# Patient Record
Sex: Female | Born: 1945 | Race: Black or African American | Hispanic: No | Marital: Married | Smoking: Never smoker
Health system: Southern US, Community
[De-identification: ages and names within clinical notes are randomized; demographics above are authoritative.]

## PROBLEM LIST (undated history)

## (undated) DIAGNOSIS — C189 Malignant neoplasm of colon, unspecified: Secondary | ICD-10-CM

## (undated) HISTORY — PX: LIVER BIOPSY: SHX301

---

## 1973-12-13 HISTORY — PX: TUBAL LIGATION: SHX77

## 1973-12-13 HISTORY — PX: CHOLECYSTECTOMY: SHX55

## 2001-05-30 ENCOUNTER — Other Ambulatory Visit: Admission: RE | Admit: 2001-05-30 | Discharge: 2001-05-30 | Payer: Self-pay | Admitting: Obstetrics and Gynecology

## 2006-12-13 HISTORY — PX: OVARY SURGERY: SHX727

## 2006-12-13 HISTORY — PX: COLECTOMY: SHX59

## 2006-12-20 ENCOUNTER — Encounter (HOSPITAL_COMMUNITY): Admission: RE | Admit: 2006-12-20 | Discharge: 2007-02-22 | Payer: Self-pay | Admitting: Family Medicine

## 2006-12-29 ENCOUNTER — Encounter: Admission: RE | Admit: 2006-12-29 | Discharge: 2006-12-29 | Payer: Self-pay | Admitting: Family Medicine

## 2006-12-30 ENCOUNTER — Ambulatory Visit: Payer: Self-pay | Admitting: Oncology

## 2007-01-02 LAB — COMPREHENSIVE METABOLIC PANEL
ALT: 10 U/L (ref 0–35)
BUN: 12 mg/dL (ref 6–23)
CO2: 21 mEq/L (ref 19–32)
Calcium: 9.4 mg/dL (ref 8.4–10.5)
Chloride: 109 mEq/L (ref 96–112)
Creatinine, Ser: 0.82 mg/dL (ref 0.40–1.20)
Glucose, Bld: 103 mg/dL — ABNORMAL HIGH (ref 70–99)
Total Bilirubin: 0.4 mg/dL (ref 0.3–1.2)

## 2007-01-02 LAB — CBC WITH DIFFERENTIAL/PLATELET
Basophils Absolute: 0 10*3/uL (ref 0.0–0.1)
Eosinophils Absolute: 0.1 10*3/uL (ref 0.0–0.5)
HGB: 10.1 g/dL — ABNORMAL LOW (ref 11.6–15.9)
LYMPH%: 17.1 % (ref 14.0–48.0)
MCV: 72.1 fL — ABNORMAL LOW (ref 81.0–101.0)
MONO#: 0.4 10*3/uL (ref 0.1–0.9)
MONO%: 5.4 % (ref 0.0–13.0)
NEUT#: 5.6 10*3/uL (ref 1.5–6.5)
Platelets: 467 10*3/uL — ABNORMAL HIGH (ref 145–400)
RDW: 24.4 % — ABNORMAL HIGH (ref 11.3–14.5)
WBC: 7.4 10*3/uL (ref 3.9–10.0)

## 2007-01-02 LAB — PROTIME-INR
INR: 1.1 — ABNORMAL LOW (ref 2.00–3.50)
Protime: 13.2 Seconds (ref 10.6–13.4)

## 2007-01-02 LAB — FERRITIN: Ferritin: 6 ng/mL — ABNORMAL LOW (ref 10–291)

## 2007-01-02 LAB — CA 125: CA 125: 7.4 U/mL (ref 0.0–30.2)

## 2007-01-02 LAB — CEA: CEA: 6.3 ng/mL — ABNORMAL HIGH (ref 0.0–5.0)

## 2007-01-05 ENCOUNTER — Ambulatory Visit (HOSPITAL_COMMUNITY): Admission: RE | Admit: 2007-01-05 | Discharge: 2007-01-05 | Payer: Self-pay | Admitting: Oncology

## 2007-01-06 ENCOUNTER — Ambulatory Visit (HOSPITAL_COMMUNITY): Admission: RE | Admit: 2007-01-06 | Discharge: 2007-01-06 | Payer: Self-pay | Admitting: Oncology

## 2007-01-09 LAB — FECAL OCCULT BLOOD, GUAIAC: Occult Blood: POSITIVE

## 2007-01-12 ENCOUNTER — Ambulatory Visit: Payer: Self-pay | Admitting: Internal Medicine

## 2007-01-13 ENCOUNTER — Ambulatory Visit (HOSPITAL_COMMUNITY): Admission: RE | Admit: 2007-01-13 | Discharge: 2007-01-13 | Payer: Self-pay | Admitting: Oncology

## 2007-01-16 ENCOUNTER — Encounter (INDEPENDENT_AMBULATORY_CARE_PROVIDER_SITE_OTHER): Payer: Self-pay | Admitting: Specialist

## 2007-01-16 ENCOUNTER — Ambulatory Visit: Payer: Self-pay | Admitting: Internal Medicine

## 2007-01-18 LAB — CBC WITH DIFFERENTIAL/PLATELET
BASO%: 0 % (ref 0.0–2.0)
EOS%: 0.9 % (ref 0.0–7.0)
HCT: 28 % — ABNORMAL LOW (ref 34.8–46.6)
MCH: 24 pg — ABNORMAL LOW (ref 26.0–34.0)
MCHC: 32.9 g/dL (ref 32.0–36.0)
NEUT%: 76.7 % (ref 39.6–76.8)
RBC: 3.83 10*6/uL (ref 3.70–5.32)
WBC: 7.9 10*3/uL (ref 3.9–10.0)
lymph#: 1.4 10*3/uL (ref 0.9–3.3)

## 2007-01-18 LAB — COMPREHENSIVE METABOLIC PANEL
Albumin: 3.7 g/dL (ref 3.5–5.2)
Alkaline Phosphatase: 77 U/L (ref 39–117)
Glucose, Bld: 89 mg/dL (ref 70–99)
Potassium: 3.4 mEq/L — ABNORMAL LOW (ref 3.5–5.3)
Sodium: 142 mEq/L (ref 135–145)
Total Protein: 7.8 g/dL (ref 6.0–8.3)

## 2007-01-18 LAB — MAGNESIUM: Magnesium: 1.9 mg/dL (ref 1.5–2.5)

## 2007-01-18 LAB — URINALYSIS, MICROSCOPIC - CHCC
Blood: NEGATIVE
Nitrite: NEGATIVE
Protein: <30 mg/dL
Specific Gravity, Urine: 1.03 (ref 1.003–1.035)
pH: 5 (ref 4.6–8.0)

## 2007-01-23 ENCOUNTER — Ambulatory Visit (HOSPITAL_COMMUNITY): Admission: RE | Admit: 2007-01-23 | Discharge: 2007-01-23 | Payer: Self-pay | Admitting: Oncology

## 2007-01-30 LAB — CBC WITH DIFFERENTIAL/PLATELET
Basophils Absolute: 0 10*3/uL (ref 0.0–0.1)
Eosinophils Absolute: 0.1 10*3/uL (ref 0.0–0.5)
HCT: 30.4 % — ABNORMAL LOW (ref 34.8–46.6)
HGB: 10 g/dL — ABNORMAL LOW (ref 11.6–15.9)
LYMPH%: 21.9 % (ref 14.0–48.0)
MCH: 25.2 pg — ABNORMAL LOW (ref 26.0–34.0)
MCV: 76.6 fL — ABNORMAL LOW (ref 81.0–101.0)
MONO%: 5.7 % (ref 0.0–13.0)
NEUT#: 4.6 10*3/uL (ref 1.5–6.5)
NEUT%: 70.5 % (ref 39.6–76.8)
Platelets: 404 10*3/uL — ABNORMAL HIGH (ref 145–400)

## 2007-01-30 LAB — COMPREHENSIVE METABOLIC PANEL
BUN: 11 mg/dL (ref 6–23)
CO2: 30 mEq/L (ref 19–32)
Creatinine, Ser: 0.95 mg/dL (ref 0.40–1.20)
Glucose, Bld: 102 mg/dL — ABNORMAL HIGH (ref 70–99)
Sodium: 141 mEq/L (ref 135–145)
Total Bilirubin: 0.6 mg/dL (ref 0.3–1.2)
Total Protein: 7.7 g/dL (ref 6.0–8.3)

## 2007-01-30 LAB — LACTATE DEHYDROGENASE: LDH: 122 U/L (ref 94–250)

## 2007-01-30 LAB — MAGNESIUM: Magnesium: 2.4 mg/dL (ref 1.5–2.5)

## 2007-02-06 LAB — COMPREHENSIVE METABOLIC PANEL
ALT: 13 U/L (ref 0–35)
AST: 21 U/L (ref 0–37)
Alkaline Phosphatase: 59 U/L (ref 39–117)
Creatinine, Ser: 0.78 mg/dL (ref 0.40–1.20)
Sodium: 140 mEq/L (ref 135–145)
Total Bilirubin: 0.5 mg/dL (ref 0.3–1.2)

## 2007-02-06 LAB — CBC WITH DIFFERENTIAL/PLATELET
BASO%: 0 % (ref 0.0–2.0)
Basophils Absolute: 0 10*3/uL (ref 0.0–0.1)
EOS%: 2.8 % (ref 0.0–7.0)
HGB: 10.5 g/dL — ABNORMAL LOW (ref 11.6–15.9)
MCH: 25.5 pg — ABNORMAL LOW (ref 26.0–34.0)
RDW: 30.4 % — ABNORMAL HIGH (ref 11.3–14.5)
WBC: 4.8 10*3/uL (ref 3.9–10.0)
lymph#: 1.4 10*3/uL (ref 0.9–3.3)

## 2007-02-09 ENCOUNTER — Ambulatory Visit: Payer: Self-pay | Admitting: Oncology

## 2007-02-13 LAB — COMPREHENSIVE METABOLIC PANEL
AST: 21 U/L (ref 0–37)
BUN: 13 mg/dL (ref 6–23)
Calcium: 9.4 mg/dL (ref 8.4–10.5)
Chloride: 105 mEq/L (ref 96–112)
Creatinine, Ser: 0.98 mg/dL (ref 0.40–1.20)
Total Bilirubin: 0.3 mg/dL (ref 0.3–1.2)

## 2007-02-13 LAB — CBC WITH DIFFERENTIAL/PLATELET
Basophils Absolute: 0 10*3/uL (ref 0.0–0.1)
EOS%: 2.4 % (ref 0.0–7.0)
HCT: 33.6 % — ABNORMAL LOW (ref 34.8–46.6)
HGB: 11 g/dL — ABNORMAL LOW (ref 11.6–15.9)
LYMPH%: 22.6 % (ref 14.0–48.0)
MCH: 25.9 pg — ABNORMAL LOW (ref 26.0–34.0)
MCV: 79.4 fL — ABNORMAL LOW (ref 81.0–101.0)
MONO%: 7.3 % (ref 0.0–13.0)
NEUT%: 67.6 % (ref 39.6–76.8)
Platelets: 476 10*3/uL — ABNORMAL HIGH (ref 145–400)
lymph#: 1.1 10*3/uL (ref 0.9–3.3)

## 2007-02-13 LAB — MAGNESIUM: Magnesium: 2.1 mg/dL (ref 1.5–2.5)

## 2007-02-21 LAB — COMPREHENSIVE METABOLIC PANEL
AST: 29 U/L (ref 0–37)
Albumin: 3.4 g/dL — ABNORMAL LOW (ref 3.5–5.2)
BUN: 10 mg/dL (ref 6–23)
Calcium: 9.2 mg/dL (ref 8.4–10.5)
Chloride: 106 mEq/L (ref 96–112)
Glucose, Bld: 130 mg/dL — ABNORMAL HIGH (ref 70–99)
Potassium: 3.3 mEq/L — ABNORMAL LOW (ref 3.5–5.3)
Sodium: 138 mEq/L (ref 135–145)
Total Protein: 7.5 g/dL (ref 6.0–8.3)

## 2007-02-21 LAB — CBC WITH DIFFERENTIAL/PLATELET
Basophils Absolute: 0 10*3/uL (ref 0.0–0.1)
Eosinophils Absolute: 0.1 10*3/uL (ref 0.0–0.5)
HGB: 11.5 g/dL — ABNORMAL LOW (ref 11.6–15.9)
NEUT#: 2.9 10*3/uL (ref 1.5–6.5)
RDW: 28.1 % — ABNORMAL HIGH (ref 11.3–14.5)
WBC: 4.4 10*3/uL (ref 3.9–10.0)
lymph#: 1.1 10*3/uL (ref 0.9–3.3)

## 2007-02-27 LAB — COMPREHENSIVE METABOLIC PANEL
ALT: 13 U/L (ref 0–35)
AST: 20 U/L (ref 0–37)
Albumin: 3.8 g/dL (ref 3.5–5.2)
Alkaline Phosphatase: 81 U/L (ref 39–117)
Glucose, Bld: 97 mg/dL (ref 70–99)
Potassium: 3.8 mEq/L (ref 3.5–5.3)
Sodium: 141 mEq/L (ref 135–145)
Total Bilirubin: 0.2 mg/dL — ABNORMAL LOW (ref 0.3–1.2)
Total Protein: 7.5 g/dL (ref 6.0–8.3)

## 2007-02-27 LAB — CBC WITH DIFFERENTIAL/PLATELET
BASO%: 0.2 % (ref 0.0–2.0)
Eosinophils Absolute: 0.1 10*3/uL (ref 0.0–0.5)
LYMPH%: 29 % (ref 14.0–48.0)
MCHC: 33 g/dL (ref 32.0–36.0)
MCV: 83.1 fL (ref 81.0–101.0)
MONO%: 10.2 % (ref 0.0–13.0)
NEUT#: 3.4 10*3/uL (ref 1.5–6.5)
RBC: 4.44 10*6/uL (ref 3.70–5.32)
RDW: 28 % — ABNORMAL HIGH (ref 11.3–14.5)
WBC: 5.7 10*3/uL (ref 3.9–10.0)

## 2007-02-27 LAB — MAGNESIUM: Magnesium: 1.8 mg/dL (ref 1.5–2.5)

## 2007-03-13 LAB — COMPREHENSIVE METABOLIC PANEL
ALT: 21 U/L (ref 0–35)
AST: 28 U/L (ref 0–37)
Albumin: 3.9 g/dL (ref 3.5–5.2)
Alkaline Phosphatase: 90 U/L (ref 39–117)
Potassium: 3.8 mEq/L (ref 3.5–5.3)
Sodium: 145 mEq/L (ref 135–145)
Total Protein: 7.5 g/dL (ref 6.0–8.3)

## 2007-03-13 LAB — CBC WITH DIFFERENTIAL/PLATELET
BASO%: 0.1 % (ref 0.0–2.0)
EOS%: 2.1 % (ref 0.0–7.0)
Eosinophils Absolute: 0.1 10*3/uL (ref 0.0–0.5)
MCH: 28.5 pg (ref 26.0–34.0)
MCHC: 34 g/dL (ref 32.0–36.0)
MCV: 83.9 fL (ref 81.0–101.0)
MONO%: 11.9 % (ref 0.0–13.0)
NEUT#: 1.9 10*3/uL (ref 1.5–6.5)
RBC: 4.64 10*6/uL (ref 3.70–5.32)
RDW: 20.7 % — ABNORMAL HIGH (ref 11.3–14.5)

## 2007-03-21 LAB — COMPREHENSIVE METABOLIC PANEL
ALT: 28 U/L (ref 0–35)
AST: 40 U/L — ABNORMAL HIGH (ref 0–37)
CO2: 22 mEq/L (ref 19–32)
Creatinine, Ser: 0.77 mg/dL (ref 0.40–1.20)
Total Bilirubin: 0.3 mg/dL (ref 0.3–1.2)

## 2007-03-21 LAB — CBC WITH DIFFERENTIAL/PLATELET
BASO%: 0.7 % (ref 0.0–2.0)
EOS%: 2.2 % (ref 0.0–7.0)
HCT: 40.5 % (ref 34.8–46.6)
LYMPH%: 44 % (ref 14.0–48.0)
MCH: 28.4 pg (ref 26.0–34.0)
MCHC: 33.7 g/dL (ref 32.0–36.0)
NEUT%: 43.8 % (ref 39.6–76.8)
Platelets: 221 10*3/uL (ref 145–400)

## 2007-03-21 LAB — LACTATE DEHYDROGENASE: LDH: 182 U/L (ref 94–250)

## 2007-03-22 ENCOUNTER — Ambulatory Visit: Payer: Self-pay | Admitting: Oncology

## 2007-03-23 ENCOUNTER — Ambulatory Visit (HOSPITAL_COMMUNITY): Admission: RE | Admit: 2007-03-23 | Discharge: 2007-03-23 | Payer: Self-pay | Admitting: Oncology

## 2007-03-27 LAB — CBC WITH DIFFERENTIAL/PLATELET
Basophils Absolute: 0 10*3/uL (ref 0.0–0.1)
Eosinophils Absolute: 0.1 10*3/uL (ref 0.0–0.5)
HGB: 13.4 g/dL (ref 11.6–15.9)
MCV: 84.9 fL (ref 81.0–101.0)
MONO#: 0.5 10*3/uL (ref 0.1–0.9)
MONO%: 13.5 % — ABNORMAL HIGH (ref 0.0–13.0)
NEUT#: 1.8 10*3/uL (ref 1.5–6.5)
RDW: 19.1 % — ABNORMAL HIGH (ref 11.3–14.5)
WBC: 3.8 10*3/uL — ABNORMAL LOW (ref 3.9–10.0)

## 2007-03-27 LAB — COMPREHENSIVE METABOLIC PANEL
ALT: 42 U/L — ABNORMAL HIGH (ref 0–35)
AST: 53 U/L — ABNORMAL HIGH (ref 0–37)
Creatinine, Ser: 0.86 mg/dL (ref 0.40–1.20)
Total Bilirubin: 0.3 mg/dL (ref 0.3–1.2)

## 2007-04-10 LAB — COMPREHENSIVE METABOLIC PANEL
Albumin: 3.9 g/dL (ref 3.5–5.2)
Alkaline Phosphatase: 106 U/L (ref 39–117)
BUN: 7 mg/dL (ref 6–23)
Glucose, Bld: 77 mg/dL (ref 70–99)
Potassium: 3.5 mEq/L (ref 3.5–5.3)
Total Bilirubin: 0.4 mg/dL (ref 0.3–1.2)

## 2007-04-10 LAB — CBC WITH DIFFERENTIAL/PLATELET
Basophils Absolute: 0 10*3/uL (ref 0.0–0.1)
Eosinophils Absolute: 0.1 10*3/uL (ref 0.0–0.5)
HCT: 42.3 % (ref 34.8–46.6)
HGB: 14.6 g/dL (ref 11.6–15.9)
LYMPH%: 32.9 % (ref 14.0–48.0)
MCV: 85.7 fL (ref 81.0–101.0)
MONO%: 12.7 % (ref 0.0–13.0)
NEUT#: 1.8 10*3/uL (ref 1.5–6.5)
NEUT%: 51.5 % (ref 39.6–76.8)
Platelets: 182 10*3/uL (ref 145–400)

## 2007-04-10 LAB — MAGNESIUM: Magnesium: 1.8 mg/dL (ref 1.5–2.5)

## 2007-04-10 LAB — LACTATE DEHYDROGENASE: LDH: 187 U/L (ref 94–250)

## 2007-04-24 LAB — COMPREHENSIVE METABOLIC PANEL
AST: 38 U/L — ABNORMAL HIGH (ref 0–37)
Albumin: 3.9 g/dL (ref 3.5–5.2)
BUN: 5 mg/dL — ABNORMAL LOW (ref 6–23)
CO2: 29 mEq/L (ref 19–32)
Calcium: 9.4 mg/dL (ref 8.4–10.5)
Chloride: 108 mEq/L (ref 96–112)
Creatinine, Ser: 0.9 mg/dL (ref 0.40–1.20)
Glucose, Bld: 87 mg/dL (ref 70–99)
Potassium: 3.5 mEq/L (ref 3.5–5.3)

## 2007-04-24 LAB — CBC WITH DIFFERENTIAL/PLATELET
Basophils Absolute: 0 10*3/uL (ref 0.0–0.1)
EOS%: 2.9 % (ref 0.0–7.0)
Eosinophils Absolute: 0.1 10*3/uL (ref 0.0–0.5)
HCT: 43.7 % (ref 34.8–46.6)
HGB: 14.9 g/dL (ref 11.6–15.9)
MCH: 29.5 pg (ref 26.0–34.0)
MONO#: 0.5 10*3/uL (ref 0.1–0.9)
NEUT#: 1.8 10*3/uL (ref 1.5–6.5)
NEUT%: 48.1 % (ref 39.6–76.8)
RDW: 19.5 % — ABNORMAL HIGH (ref 11.3–14.5)
lymph#: 1.3 10*3/uL (ref 0.9–3.3)

## 2007-04-24 LAB — LACTATE DEHYDROGENASE: LDH: 190 U/L (ref 94–250)

## 2007-04-24 LAB — MAGNESIUM: Magnesium: 1.8 mg/dL (ref 1.5–2.5)

## 2007-05-02 LAB — CBC WITH DIFFERENTIAL/PLATELET
Basophils Absolute: 0 10*3/uL (ref 0.0–0.1)
Eosinophils Absolute: 0 10*3/uL (ref 0.0–0.5)
HGB: 14 g/dL (ref 11.6–15.9)
MCV: 86.4 fL (ref 81.0–101.0)
MONO#: 0.2 10*3/uL (ref 0.1–0.9)
MONO%: 9.8 % (ref 0.0–13.0)
NEUT#: 1.4 10*3/uL — ABNORMAL LOW (ref 1.5–6.5)
Platelets: 125 10*3/uL — ABNORMAL LOW (ref 145–400)
RBC: 4.83 10*6/uL (ref 3.70–5.32)
RDW: 15.8 % — ABNORMAL HIGH (ref 11.3–14.5)
WBC: 2.5 10*3/uL — ABNORMAL LOW (ref 3.9–10.0)

## 2007-05-04 ENCOUNTER — Ambulatory Visit: Payer: Self-pay | Admitting: Oncology

## 2007-05-09 LAB — CBC WITH DIFFERENTIAL/PLATELET
Basophils Absolute: 0 10*3/uL (ref 0.0–0.1)
Eosinophils Absolute: 0 10*3/uL (ref 0.0–0.5)
HCT: 40.2 % (ref 34.8–46.6)
LYMPH%: 38.3 % (ref 14.0–48.0)
MCV: 86.4 fL (ref 81.0–101.0)
MONO#: 0.4 10*3/uL (ref 0.1–0.9)
MONO%: 12.8 % (ref 0.0–13.0)
NEUT#: 1.3 10*3/uL — ABNORMAL LOW (ref 1.5–6.5)
NEUT%: 46.7 % (ref 39.6–76.8)
Platelets: 124 10*3/uL — ABNORMAL LOW (ref 145–400)
RBC: 4.65 10*6/uL (ref 3.70–5.32)
WBC: 2.8 10*3/uL — ABNORMAL LOW (ref 3.9–10.0)

## 2007-05-09 LAB — COMPREHENSIVE METABOLIC PANEL
Alkaline Phosphatase: 99 U/L (ref 39–117)
BUN: 7 mg/dL (ref 6–23)
CO2: 27 mEq/L (ref 19–32)
Creatinine, Ser: 0.81 mg/dL (ref 0.40–1.20)
Glucose, Bld: 107 mg/dL — ABNORMAL HIGH (ref 70–99)
Total Bilirubin: 0.6 mg/dL (ref 0.3–1.2)
Total Protein: 7.4 g/dL (ref 6.0–8.3)

## 2007-05-09 LAB — LACTATE DEHYDROGENASE: LDH: 182 U/L (ref 94–250)

## 2007-05-09 LAB — MAGNESIUM: Magnesium: 1.8 mg/dL (ref 1.5–2.5)

## 2007-05-18 ENCOUNTER — Ambulatory Visit (HOSPITAL_COMMUNITY): Admission: RE | Admit: 2007-05-18 | Discharge: 2007-05-18 | Payer: Self-pay | Admitting: Oncology

## 2007-05-22 LAB — COMPREHENSIVE METABOLIC PANEL
ALT: 29 U/L (ref 0–35)
AST: 44 U/L — ABNORMAL HIGH (ref 0–37)
Albumin: 3.8 g/dL (ref 3.5–5.2)
CO2: 24 mEq/L (ref 19–32)
Calcium: 9 mg/dL (ref 8.4–10.5)
Chloride: 109 mEq/L (ref 96–112)
Creatinine, Ser: 0.82 mg/dL (ref 0.40–1.20)
Potassium: 3.9 mEq/L (ref 3.5–5.3)
Sodium: 143 mEq/L (ref 135–145)
Total Protein: 7.4 g/dL (ref 6.0–8.3)

## 2007-05-22 LAB — CBC WITH DIFFERENTIAL/PLATELET
BASO%: 1.3 % (ref 0.0–2.0)
EOS%: 1.4 % (ref 0.0–7.0)
HCT: 44 % (ref 34.8–46.6)
MCH: 29.9 pg (ref 26.0–34.0)
MCHC: 33.6 g/dL (ref 32.0–36.0)
MONO#: 0.4 10*3/uL (ref 0.1–0.9)
RBC: 4.95 10*6/uL (ref 3.70–5.32)
RDW: 16.8 % — ABNORMAL HIGH (ref 11.3–14.5)
WBC: 3.4 10*3/uL — ABNORMAL LOW (ref 3.9–10.0)
lymph#: 1.1 10*3/uL (ref 0.9–3.3)

## 2007-05-22 LAB — LACTATE DEHYDROGENASE: LDH: 183 U/L (ref 94–250)

## 2007-05-30 LAB — CBC WITH DIFFERENTIAL/PLATELET
EOS%: 1.1 % (ref 0.0–7.0)
Eosinophils Absolute: 0 10*3/uL (ref 0.0–0.5)
LYMPH%: 28.1 % (ref 14.0–48.0)
MCH: 30.8 pg (ref 26.0–34.0)
MCV: 88.2 fL (ref 81.0–101.0)
MONO%: 8.9 % (ref 0.0–13.0)
NEUT#: 2.5 10*3/uL (ref 1.5–6.5)
Platelets: 146 10*3/uL (ref 145–400)
RBC: 4.59 10*6/uL (ref 3.70–5.32)

## 2007-06-05 LAB — COMPREHENSIVE METABOLIC PANEL
ALT: 27 U/L (ref 0–35)
AST: 44 U/L — ABNORMAL HIGH (ref 0–37)
Albumin: 3.7 g/dL (ref 3.5–5.2)
Alkaline Phosphatase: 143 U/L — ABNORMAL HIGH (ref 39–117)
Potassium: 3.9 mEq/L (ref 3.5–5.3)
Sodium: 141 mEq/L (ref 135–145)
Total Protein: 7.6 g/dL (ref 6.0–8.3)

## 2007-06-05 LAB — CBC WITH DIFFERENTIAL/PLATELET
BASO%: 0.5 % (ref 0.0–2.0)
LYMPH%: 33.5 % (ref 14.0–48.0)
MCHC: 35 g/dL (ref 32.0–36.0)
MCV: 90.7 fL (ref 81.0–101.0)
MONO#: 0.5 10*3/uL (ref 0.1–0.9)
MONO%: 15.1 % — ABNORMAL HIGH (ref 0.0–13.0)
Platelets: 185 10*3/uL (ref 145–400)
RBC: 4.57 10*6/uL (ref 3.70–5.32)
RDW: 18.5 % — ABNORMAL HIGH (ref 11.3–14.5)
WBC: 3.6 10*3/uL — ABNORMAL LOW (ref 3.9–10.0)

## 2007-06-14 ENCOUNTER — Ambulatory Visit: Payer: Self-pay | Admitting: Oncology

## 2007-06-19 LAB — COMPREHENSIVE METABOLIC PANEL
ALT: 24 U/L (ref 0–35)
AST: 40 U/L — ABNORMAL HIGH (ref 0–37)
BUN: 8 mg/dL (ref 6–23)
CO2: 24 mEq/L (ref 19–32)
Creatinine, Ser: 0.85 mg/dL (ref 0.40–1.20)
Total Bilirubin: 0.6 mg/dL (ref 0.3–1.2)

## 2007-06-19 LAB — CBC WITH DIFFERENTIAL/PLATELET
BASO%: 0.5 % (ref 0.0–2.0)
Basophils Absolute: 0 10*3/uL (ref 0.0–0.1)
EOS%: 2.1 % (ref 0.0–7.0)
HCT: 40.5 % (ref 34.8–46.6)
LYMPH%: 32.3 % (ref 14.0–48.0)
MCH: 32.5 pg (ref 26.0–34.0)
MCHC: 35.4 g/dL (ref 32.0–36.0)
NEUT%: 46.1 % (ref 39.6–76.8)
Platelets: 168 10*3/uL (ref 145–400)

## 2007-06-19 LAB — LACTATE DEHYDROGENASE: LDH: 180 U/L (ref 94–250)

## 2007-07-03 LAB — COMPREHENSIVE METABOLIC PANEL
Alkaline Phosphatase: 127 U/L — ABNORMAL HIGH (ref 39–117)
Glucose, Bld: 65 mg/dL — ABNORMAL LOW (ref 70–99)
Sodium: 143 mEq/L (ref 135–145)
Total Bilirubin: 0.7 mg/dL (ref 0.3–1.2)
Total Protein: 7.3 g/dL (ref 6.0–8.3)

## 2007-07-03 LAB — CBC WITH DIFFERENTIAL/PLATELET
Eosinophils Absolute: 0.1 10*3/uL (ref 0.0–0.5)
LYMPH%: 18.6 % (ref 14.0–48.0)
MCHC: 34.3 g/dL (ref 32.0–36.0)
MCV: 95.1 fL (ref 81.0–101.0)
MONO%: 12.7 % (ref 0.0–13.0)
Platelets: 216 10*3/uL (ref 145–400)
RBC: 4.36 10*6/uL (ref 3.70–5.32)

## 2007-07-03 LAB — MAGNESIUM: Magnesium: 1.7 mg/dL (ref 1.5–2.5)

## 2007-07-13 ENCOUNTER — Ambulatory Visit (HOSPITAL_COMMUNITY): Admission: RE | Admit: 2007-07-13 | Discharge: 2007-07-13 | Payer: Self-pay | Admitting: Oncology

## 2007-07-17 LAB — COMPREHENSIVE METABOLIC PANEL
AST: 31 U/L (ref 0–37)
Albumin: 3.7 g/dL (ref 3.5–5.2)
BUN: 13 mg/dL (ref 6–23)
Calcium: 9.4 mg/dL (ref 8.4–10.5)
Chloride: 106 mEq/L (ref 96–112)
Glucose, Bld: 91 mg/dL (ref 70–99)
Potassium: 4 mEq/L (ref 3.5–5.3)
Total Protein: 7.6 g/dL (ref 6.0–8.3)

## 2007-07-17 LAB — CBC WITH DIFFERENTIAL/PLATELET
Basophils Absolute: 0 10*3/uL (ref 0.0–0.1)
EOS%: 1.4 % (ref 0.0–7.0)
Eosinophils Absolute: 0.1 10*3/uL (ref 0.0–0.5)
HGB: 14 g/dL (ref 11.6–15.9)
NEUT#: 4.3 10*3/uL (ref 1.5–6.5)
RDW: 15.6 % — ABNORMAL HIGH (ref 11.3–14.5)
lymph#: 1 10*3/uL (ref 0.9–3.3)

## 2007-07-26 ENCOUNTER — Ambulatory Visit (HOSPITAL_COMMUNITY): Admission: RE | Admit: 2007-07-26 | Discharge: 2007-07-26 | Payer: Self-pay | Admitting: Oncology

## 2007-08-06 ENCOUNTER — Ambulatory Visit: Payer: Self-pay | Admitting: Oncology

## 2007-08-07 LAB — CBC WITH DIFFERENTIAL/PLATELET
Basophils Absolute: 0 10*3/uL (ref 0.0–0.1)
EOS%: 0.5 % (ref 0.0–7.0)
Eosinophils Absolute: 0 10*3/uL (ref 0.0–0.5)
HGB: 14.6 g/dL (ref 11.6–15.9)
NEUT#: 6.6 10*3/uL — ABNORMAL HIGH (ref 1.5–6.5)
RDW: 14 % (ref 11.3–14.5)
WBC: 9 10*3/uL (ref 3.9–10.0)
lymph#: 1.8 10*3/uL (ref 0.9–3.3)

## 2007-08-07 LAB — COMPREHENSIVE METABOLIC PANEL
AST: 23 U/L (ref 0–37)
Albumin: 4.1 g/dL (ref 3.5–5.2)
BUN: 16 mg/dL (ref 6–23)
Calcium: 9.8 mg/dL (ref 8.4–10.5)
Chloride: 102 mEq/L (ref 96–112)
Glucose, Bld: 116 mg/dL — ABNORMAL HIGH (ref 70–99)
Potassium: 3.7 mEq/L (ref 3.5–5.3)
Total Protein: 8.4 g/dL — ABNORMAL HIGH (ref 6.0–8.3)

## 2007-08-21 LAB — COMPREHENSIVE METABOLIC PANEL
AST: 22 U/L (ref 0–37)
Albumin: 3.8 g/dL (ref 3.5–5.2)
Alkaline Phosphatase: 113 U/L (ref 39–117)
BUN: 16 mg/dL (ref 6–23)
Calcium: 9.6 mg/dL (ref 8.4–10.5)
Chloride: 104 mEq/L (ref 96–112)
Glucose, Bld: 91 mg/dL (ref 70–99)
Potassium: 3.8 mEq/L (ref 3.5–5.3)
Sodium: 140 mEq/L (ref 135–145)
Total Protein: 7.8 g/dL (ref 6.0–8.3)

## 2007-08-21 LAB — CBC WITH DIFFERENTIAL/PLATELET
BASO%: 0.4 % (ref 0.0–2.0)
EOS%: 0.6 % (ref 0.0–7.0)
LYMPH%: 18.5 % (ref 14.0–48.0)
MCH: 32.7 pg (ref 26.0–34.0)
MCHC: 34.8 g/dL (ref 32.0–36.0)
MONO#: 0.4 10*3/uL (ref 0.1–0.9)
MONO%: 5.8 % (ref 0.0–13.0)
Platelets: 269 10*3/uL (ref 145–400)
RBC: 4.23 10*6/uL (ref 3.70–5.32)
WBC: 6.5 10*3/uL (ref 3.9–10.0)

## 2007-08-21 LAB — RESEARCH LABS

## 2007-09-12 LAB — CBC WITH DIFFERENTIAL/PLATELET
BASO%: 0.5 % (ref 0.0–2.0)
EOS%: 0.5 % (ref 0.0–7.0)
HCT: 37.5 % (ref 34.8–46.6)
LYMPH%: 17.5 % (ref 14.0–48.0)
MCH: 32.2 pg (ref 26.0–34.0)
MCHC: 35.3 g/dL (ref 32.0–36.0)
MONO%: 10 % (ref 0.0–13.0)
NEUT%: 71.5 % (ref 39.6–76.8)
Platelets: 256 10*3/uL (ref 145–400)
RBC: 4.11 10*6/uL (ref 3.70–5.32)

## 2007-09-12 LAB — COMPREHENSIVE METABOLIC PANEL
ALT: 33 U/L (ref 0–35)
AST: 35 U/L (ref 0–37)
Alkaline Phosphatase: 131 U/L — ABNORMAL HIGH (ref 39–117)
Creatinine, Ser: 0.67 mg/dL (ref 0.40–1.20)
Sodium: 138 mEq/L (ref 135–145)
Total Bilirubin: 0.9 mg/dL (ref 0.3–1.2)

## 2007-09-12 LAB — CEA: CEA: 12.7 ng/mL — ABNORMAL HIGH (ref 0.0–5.0)

## 2007-09-13 ENCOUNTER — Ambulatory Visit (HOSPITAL_COMMUNITY): Admission: RE | Admit: 2007-09-13 | Discharge: 2007-09-13 | Payer: Self-pay | Admitting: Oncology

## 2007-09-19 ENCOUNTER — Ambulatory Visit: Payer: Self-pay | Admitting: Oncology

## 2007-10-05 ENCOUNTER — Ambulatory Visit: Payer: Self-pay | Admitting: Cardiology

## 2007-10-05 LAB — CBC WITH DIFFERENTIAL/PLATELET
Basophils Absolute: 0 10*3/uL (ref 0.0–0.1)
Eosinophils Absolute: 0 10*3/uL (ref 0.0–0.5)
HGB: 13.8 g/dL (ref 11.6–15.9)
LYMPH%: 16.3 % (ref 14.0–48.0)
MCV: 91.7 fL (ref 81.0–101.0)
MONO#: 0.4 10*3/uL (ref 0.1–0.9)
MONO%: 6.2 % (ref 0.0–13.0)
NEUT#: 4.9 10*3/uL (ref 1.5–6.5)
Platelets: 390 10*3/uL (ref 145–400)
RDW: 13.6 % (ref 11.3–14.5)
WBC: 6.3 10*3/uL (ref 3.9–10.0)

## 2007-10-05 LAB — BASIC METABOLIC PANEL
BUN: 9 mg/dL (ref 6–23)
CO2: 27 mEq/L (ref 19–32)
Glucose, Bld: 103 mg/dL — ABNORMAL HIGH (ref 70–99)
Potassium: 3.8 mEq/L (ref 3.5–5.3)
Sodium: 136 mEq/L (ref 135–145)

## 2007-11-07 ENCOUNTER — Ambulatory Visit: Payer: Self-pay | Admitting: Oncology

## 2007-12-05 LAB — COMPREHENSIVE METABOLIC PANEL
Alkaline Phosphatase: 162 U/L — ABNORMAL HIGH (ref 39–117)
BUN: 9 mg/dL (ref 6–23)
CO2: 21 mEq/L (ref 19–32)
Creatinine, Ser: 0.65 mg/dL (ref 0.40–1.20)
Glucose, Bld: 85 mg/dL (ref 70–99)
Sodium: 143 mEq/L (ref 135–145)
Total Bilirubin: 0.5 mg/dL (ref 0.3–1.2)
Total Protein: 7.6 g/dL (ref 6.0–8.3)

## 2007-12-05 LAB — MAGNESIUM: Magnesium: 1.9 mg/dL (ref 1.5–2.5)

## 2007-12-21 ENCOUNTER — Ambulatory Visit: Payer: Self-pay | Admitting: Oncology

## 2008-02-06 ENCOUNTER — Ambulatory Visit: Payer: Self-pay | Admitting: Oncology

## 2008-02-08 LAB — CBC WITH DIFFERENTIAL/PLATELET
Basophils Absolute: 0 10*3/uL (ref 0.0–0.1)
Eosinophils Absolute: 0.1 10*3/uL (ref 0.0–0.5)
HCT: 34 % — ABNORMAL LOW (ref 34.8–46.6)
HGB: 11.9 g/dL (ref 11.6–15.9)
LYMPH%: 22.2 % (ref 14.0–48.0)
MCV: 88.3 fL (ref 81.0–101.0)
MONO#: 0.3 10*3/uL (ref 0.1–0.9)
MONO%: 6 % (ref 0.0–13.0)
NEUT#: 3.7 10*3/uL (ref 1.5–6.5)
NEUT%: 70.3 % (ref 39.6–76.8)
Platelets: 249 10*3/uL (ref 145–400)

## 2008-02-08 LAB — COMPREHENSIVE METABOLIC PANEL
Alkaline Phosphatase: 119 U/L — ABNORMAL HIGH (ref 39–117)
BUN: 12 mg/dL (ref 6–23)
CO2: 25 mEq/L (ref 19–32)
Glucose, Bld: 110 mg/dL — ABNORMAL HIGH (ref 70–99)
Total Bilirubin: 0.5 mg/dL (ref 0.3–1.2)

## 2008-03-19 ENCOUNTER — Ambulatory Visit: Payer: Self-pay | Admitting: Oncology

## 2008-03-19 ENCOUNTER — Encounter: Payer: Self-pay | Admitting: Internal Medicine

## 2008-03-19 LAB — CBC WITH DIFFERENTIAL/PLATELET
Basophils Absolute: 0 10*3/uL (ref 0.0–0.1)
Eosinophils Absolute: 0 10*3/uL (ref 0.0–0.5)
HGB: 12.8 g/dL (ref 11.6–15.9)
NEUT#: 1.7 10*3/uL (ref 1.5–6.5)
RBC: 4.15 10*6/uL (ref 3.70–5.32)
RDW: 15 % — ABNORMAL HIGH (ref 11.3–14.5)
WBC: 3.3 10*3/uL — ABNORMAL LOW (ref 3.9–10.0)
lymph#: 1.2 10*3/uL (ref 0.9–3.3)

## 2008-03-19 LAB — COMPREHENSIVE METABOLIC PANEL
ALT: 20 U/L (ref 0–35)
Albumin: 3.3 g/dL — ABNORMAL LOW (ref 3.5–5.2)
CO2: 26 mEq/L (ref 19–32)
Chloride: 108 mEq/L (ref 96–112)
Glucose, Bld: 114 mg/dL — ABNORMAL HIGH (ref 70–99)
Potassium: 3.5 mEq/L (ref 3.5–5.3)
Sodium: 137 mEq/L (ref 135–145)
Total Protein: 7.3 g/dL (ref 6.0–8.3)

## 2008-03-19 LAB — UA PROTEIN, DIPSTICK - CHCC: Protein, Urine: NEGATIVE mg/dL

## 2008-04-02 LAB — COMPREHENSIVE METABOLIC PANEL
ALT: 21 U/L (ref 0–35)
AST: 28 U/L (ref 0–37)
Alkaline Phosphatase: 108 U/L (ref 39–117)
BUN: 9 mg/dL (ref 6–23)
Calcium: 9.2 mg/dL (ref 8.4–10.5)
Chloride: 108 mEq/L (ref 96–112)
Creatinine, Ser: 0.81 mg/dL (ref 0.40–1.20)
Potassium: 3.5 mEq/L (ref 3.5–5.3)

## 2008-04-02 LAB — CBC WITH DIFFERENTIAL/PLATELET
BASO%: 0.8 % (ref 0.0–2.0)
Basophils Absolute: 0 10*3/uL (ref 0.0–0.1)
EOS%: 1.1 % (ref 0.0–7.0)
MCH: 31.1 pg (ref 26.0–34.0)
MCHC: 33.8 g/dL (ref 32.0–36.0)
MCV: 92.1 fL (ref 81.0–101.0)
MONO%: 9.3 % (ref 0.0–13.0)
NEUT%: 55 % (ref 39.6–76.8)
RDW: 15.4 % — ABNORMAL HIGH (ref 11.3–14.5)
lymph#: 1.4 10*3/uL (ref 0.9–3.3)

## 2008-04-02 LAB — UA PROTEIN, DIPSTICK - CHCC: Protein, Urine: 30 mg/dL

## 2008-04-12 IMAGING — US IR US GUIDE VASC ACCESS RIGHT
1 series · 1 of 1 positions shown · non-contrast
Comparison: none

CLINICAL DATA: Metastatic colon carcinoma.  Patient requires portacath to begin chemotherapy.
 SINGLE-LUMEN IMPLANTED PORTACATH WITH ULTRASOUND AND FLUOROSCOPIC GUIDANCE ? 01/23/07: 
 Prior to the procedure informed consent was obtained. The patient received 1 gm IV Ancef.  
 Sedation:  4 mg IV Versed, 100 mcg IV fentanyl.
 Total Moderate Sedation Time:   45 minutes.
 Fluoro Time:   0.2 minutes.
 Procedure:  The right neck and chest were sterilely prepped and draped.  Local anesthesia was provided with 1% lidocaine as well as lidocaine with epinephrine.  A small venotomy incision was created at the base of the neck.  Under direct ultrasound guidance a 21 gauge needle was advanced into the right internal jugular vein.  After securing guidewire access an 8 French dilator was placed.  A wire was kinked to measure an appropriate length of catheter.
 A subcutaneous port pocket was formed along the right upper chest wall utilizing sharp and blunt dissection.  Portable cautery was utilized.  The pocket was flushed with sterile saline.  Maykelis Ben Basat Power Port with attached 8 French catheter was placed.  The catheter was tunneled from the port pocket site to the venotomy incision.  The port was secured in the pocket with two separate Ethilon tacking sutures.  The external catheter was cut to appropriate length. This catheter was then placed through a Peel-Away sheath.  Final catheter position was confirmed with a fluoroscopic spot image. The catheter was accessed, aspirated, and flushed with saline and injected with a heparinized saline solution. The access needle was then removed.  Both the port pocket incision and the venotomy incision were then closed with subcutaneous 3-0 Monocril, subcuticular 4-0 Vicryl, and Dermabond.  
 Complications:  None.   No pneumothorax

[Series 1: sp us guide vasc access*right* · 1 of 1 slices shown]
[im 1/1]
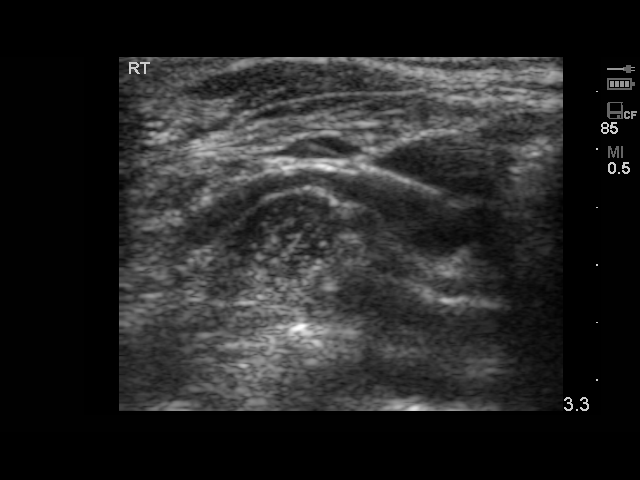

[1 of 1 positions shown; findings below may reference images not displayed]

FINDINGS: After placement of the catheter, the catheter tip lies at the cavoatrial junction.  The catheter is ready for immediate use.
IMPRESSION: Placement of a single-lumen implanted portacath via right internal jugular vein with the catheter tip at the cavoatrial junction.  A Power Port was placed which does have indications for power injector use.

## 2008-04-30 ENCOUNTER — Ambulatory Visit: Payer: Self-pay | Admitting: Oncology

## 2008-05-14 LAB — COMPREHENSIVE METABOLIC PANEL
Alkaline Phosphatase: 97 U/L (ref 39–117)
BUN: 17 mg/dL (ref 6–23)
CO2: 24 mEq/L (ref 19–32)
Glucose, Bld: 94 mg/dL (ref 70–99)
Total Bilirubin: 0.5 mg/dL (ref 0.3–1.2)
Total Protein: 7.2 g/dL (ref 6.0–8.3)

## 2008-05-14 LAB — CBC WITH DIFFERENTIAL/PLATELET
BASO%: 0.4 % (ref 0.0–2.0)
HCT: 36.5 % (ref 34.8–46.6)
HGB: 12.7 g/dL (ref 11.6–15.9)
MCHC: 34.8 g/dL (ref 32.0–36.0)
MONO#: 0.3 10*3/uL (ref 0.1–0.9)
NEUT%: 51 % (ref 39.6–76.8)
WBC: 3.1 10*3/uL — ABNORMAL LOW (ref 3.9–10.0)
lymph#: 1.1 10*3/uL (ref 0.9–3.3)

## 2008-05-14 LAB — UA PROTEIN, DIPSTICK - CHCC: Protein, Urine: <30 mg/dL

## 2008-05-28 ENCOUNTER — Ambulatory Visit (HOSPITAL_COMMUNITY): Admission: RE | Admit: 2008-05-28 | Discharge: 2008-05-28 | Payer: Self-pay | Admitting: Oncology

## 2008-06-10 ENCOUNTER — Ambulatory Visit: Payer: Self-pay | Admitting: Oncology

## 2008-06-10 LAB — CBC WITH DIFFERENTIAL/PLATELET
Basophils Absolute: 0 10*3/uL (ref 0.0–0.1)
EOS%: 1.4 % (ref 0.0–7.0)
Eosinophils Absolute: 0.1 10*3/uL (ref 0.0–0.5)
HCT: 39.6 % (ref 34.8–46.6)
HGB: 13.7 g/dL (ref 11.6–15.9)
MCH: 32.4 pg (ref 26.0–34.0)
MONO#: 0.4 10*3/uL (ref 0.1–0.9)
NEUT%: 67.8 % (ref 39.6–76.8)
lymph#: 1.1 10*3/uL (ref 0.9–3.3)

## 2008-06-10 LAB — COMPREHENSIVE METABOLIC PANEL
Albumin: 3.7 g/dL (ref 3.5–5.2)
BUN: 11 mg/dL (ref 6–23)
CO2: 31 mEq/L (ref 19–32)
Calcium: 9.6 mg/dL (ref 8.4–10.5)
Chloride: 106 mEq/L (ref 96–112)
Glucose, Bld: 107 mg/dL — ABNORMAL HIGH (ref 70–99)
Potassium: 3.9 mEq/L (ref 3.5–5.3)

## 2008-06-24 LAB — COMPREHENSIVE METABOLIC PANEL
ALT: 18 U/L (ref 0–35)
Albumin: 4 g/dL (ref 3.5–5.2)
Alkaline Phosphatase: 88 U/L (ref 39–117)
CO2: 23 mEq/L (ref 19–32)
Glucose, Bld: 108 mg/dL — ABNORMAL HIGH (ref 70–99)
Potassium: 4.2 mEq/L (ref 3.5–5.3)
Sodium: 142 mEq/L (ref 135–145)
Total Protein: 7 g/dL (ref 6.0–8.3)

## 2008-06-24 LAB — CBC WITH DIFFERENTIAL/PLATELET
Basophils Absolute: 0 10*3/uL (ref 0.0–0.1)
EOS%: 1.1 % (ref 0.0–7.0)
HCT: 39.2 % (ref 34.8–46.6)
HGB: 13.3 g/dL (ref 11.6–15.9)
MCH: 32.1 pg (ref 26.0–34.0)
MCV: 94.3 fL (ref 81.0–101.0)
MONO%: 8 % (ref 0.0–13.0)
NEUT%: 67.2 % (ref 39.6–76.8)
Platelets: 158 10*3/uL (ref 145–400)

## 2008-07-08 LAB — CBC WITH DIFFERENTIAL/PLATELET
BASO%: 0.3 % (ref 0.0–2.0)
EOS%: 1.8 % (ref 0.0–7.0)
Eosinophils Absolute: 0.1 10*3/uL (ref 0.0–0.5)
LYMPH%: 28.7 % (ref 14.0–48.0)
MCHC: 34.5 g/dL (ref 32.0–36.0)
MCV: 94.1 fL (ref 81.0–101.0)
MONO%: 10 % (ref 0.0–13.0)
NEUT#: 1.9 10*3/uL (ref 1.5–6.5)
RBC: 3.9 10*6/uL (ref 3.70–5.32)
RDW: 15.4 % — ABNORMAL HIGH (ref 11.3–14.5)

## 2008-07-08 LAB — COMPREHENSIVE METABOLIC PANEL
ALT: 21 U/L (ref 0–35)
AST: 24 U/L (ref 0–37)
Alkaline Phosphatase: 94 U/L (ref 39–117)
CO2: 25 mEq/L (ref 19–32)
Creatinine, Ser: 1.01 mg/dL (ref 0.40–1.20)
Sodium: 142 mEq/L (ref 135–145)
Total Bilirubin: 0.4 mg/dL (ref 0.3–1.2)
Total Protein: 7.6 g/dL (ref 6.0–8.3)

## 2008-07-08 LAB — UA PROTEIN, DIPSTICK - CHCC: Protein, Urine: NEGATIVE mg/dL

## 2008-08-05 ENCOUNTER — Ambulatory Visit: Payer: Self-pay | Admitting: Oncology

## 2008-08-06 LAB — CBC WITH DIFFERENTIAL/PLATELET
BASO%: 0.9 % (ref 0.0–2.0)
EOS%: 1.4 % (ref 0.0–7.0)
HCT: 38 % (ref 34.8–46.6)
MCH: 32.3 pg (ref 26.0–34.0)
MCHC: 33.7 g/dL (ref 32.0–36.0)
NEUT%: 50.2 % (ref 39.6–76.8)
lymph#: 1.1 10*3/uL (ref 0.9–3.3)

## 2008-08-06 LAB — COMPREHENSIVE METABOLIC PANEL
ALT: 19 U/L (ref 0–35)
AST: 19 U/L (ref 0–37)
Calcium: 8.7 mg/dL (ref 8.4–10.5)
Chloride: 110 mEq/L (ref 96–112)
Creatinine, Ser: 0.81 mg/dL (ref 0.40–1.20)
Total Bilirubin: 0.3 mg/dL (ref 0.3–1.2)

## 2008-08-21 LAB — CBC WITH DIFFERENTIAL/PLATELET
BASO%: 0.6 % (ref 0.0–2.0)
EOS%: 2 % (ref 0.0–7.0)
LYMPH%: 39.6 % (ref 14.0–48.0)
MCHC: 34.1 g/dL (ref 32.0–36.0)
MONO#: 0.4 10*3/uL (ref 0.1–0.9)
Platelets: 216 10*3/uL (ref 145–400)
RBC: 4.11 10*6/uL (ref 3.70–5.32)
WBC: 3.2 10*3/uL — ABNORMAL LOW (ref 3.9–10.0)

## 2008-08-21 LAB — COMPREHENSIVE METABOLIC PANEL
ALT: 18 U/L (ref 0–35)
AST: 26 U/L (ref 0–37)
Alkaline Phosphatase: 75 U/L (ref 39–117)
Calcium: 9.3 mg/dL (ref 8.4–10.5)
Chloride: 105 mEq/L (ref 96–112)
Creatinine, Ser: 1.04 mg/dL (ref 0.40–1.20)
Total Bilirubin: 0.6 mg/dL (ref 0.3–1.2)

## 2008-08-21 LAB — UA PROTEIN, DIPSTICK - CHCC: Protein, Urine: NEGATIVE mg/dL

## 2008-09-02 LAB — CBC WITH DIFFERENTIAL/PLATELET
BASO%: 0.4 % (ref 0.0–2.0)
Basophils Absolute: 0 10*3/uL (ref 0.0–0.1)
EOS%: 1.4 % (ref 0.0–7.0)
HCT: 36.6 % (ref 34.8–46.6)
LYMPH%: 29.4 % (ref 14.0–48.0)
MCH: 33.3 pg (ref 26.0–34.0)
MCHC: 34.4 g/dL (ref 32.0–36.0)
MCV: 96.9 fL (ref 81.0–101.0)
MONO%: 8.9 % (ref 0.0–13.0)
NEUT%: 59.9 % (ref 39.6–76.8)
Platelets: 233 10*3/uL (ref 145–400)

## 2008-09-02 LAB — COMPREHENSIVE METABOLIC PANEL
AST: 26 U/L (ref 0–37)
Alkaline Phosphatase: 81 U/L (ref 39–117)
BUN: 12 mg/dL (ref 6–23)
Creatinine, Ser: 1.06 mg/dL (ref 0.40–1.20)
Glucose, Bld: 101 mg/dL — ABNORMAL HIGH (ref 70–99)

## 2008-09-17 LAB — COMPREHENSIVE METABOLIC PANEL
ALT: 17 U/L (ref 0–35)
AST: 17 U/L (ref 0–37)
BUN: 13 mg/dL (ref 6–23)
Creatinine, Ser: 0.95 mg/dL (ref 0.40–1.20)
Total Bilirubin: 0.5 mg/dL (ref 0.3–1.2)

## 2008-09-17 LAB — CBC WITH DIFFERENTIAL/PLATELET
BASO%: 0.6 % (ref 0.0–2.0)
EOS%: 1.9 % (ref 0.0–7.0)
Eosinophils Absolute: 0.1 10*3/uL (ref 0.0–0.5)
MCH: 32.2 pg (ref 26.0–34.0)
MCV: 97 fL (ref 81.0–101.0)
MONO%: 12.7 % (ref 0.0–13.0)
NEUT#: 2 10*3/uL (ref 1.5–6.5)
RBC: 4.16 10*6/uL (ref 3.70–5.32)
RDW: 15.1 % — ABNORMAL HIGH (ref 11.3–14.5)

## 2008-09-17 LAB — UA PROTEIN, DIPSTICK - CHCC: Protein, Urine: <30 mg/dL

## 2008-09-27 ENCOUNTER — Ambulatory Visit: Payer: Self-pay | Admitting: Oncology

## 2008-10-01 LAB — CBC WITH DIFFERENTIAL/PLATELET
BASO%: 0.9 % (ref 0.0–2.0)
Eosinophils Absolute: 0.1 10*3/uL (ref 0.0–0.5)
LYMPH%: 39.9 % (ref 14.0–48.0)
MCHC: 33.6 g/dL (ref 32.0–36.0)
MCV: 96.9 fL (ref 81.0–101.0)
MONO%: 12.5 % (ref 0.0–13.0)
NEUT%: 44.8 % (ref 39.6–76.8)
Platelets: 223 10*3/uL (ref 145–400)
RBC: 4.11 10*6/uL (ref 3.70–5.32)

## 2008-10-01 LAB — COMPREHENSIVE METABOLIC PANEL
ALT: 20 U/L (ref 0–35)
Alkaline Phosphatase: 75 U/L (ref 39–117)
Sodium: 141 mEq/L (ref 135–145)
Total Bilirubin: 0.6 mg/dL (ref 0.3–1.2)
Total Protein: 6.6 g/dL (ref 6.0–8.3)

## 2008-10-01 LAB — UA PROTEIN, DIPSTICK - CHCC: Protein, Urine: NEGATIVE mg/dL

## 2008-10-10 ENCOUNTER — Ambulatory Visit (HOSPITAL_COMMUNITY): Admission: RE | Admit: 2008-10-10 | Discharge: 2008-10-10 | Payer: Self-pay | Admitting: Oncology

## 2008-10-15 LAB — CBC WITH DIFFERENTIAL/PLATELET
BASO%: 0.7 % (ref 0.0–2.0)
EOS%: 1.7 % (ref 0.0–7.0)
LYMPH%: 18.9 % (ref 14.0–48.0)
MCH: 32.8 pg (ref 26.0–34.0)
MCHC: 34.2 g/dL (ref 32.0–36.0)
MONO#: 0.5 10*3/uL (ref 0.1–0.9)
Platelets: 207 10*3/uL (ref 145–400)
RBC: 4.15 10*6/uL (ref 3.70–5.32)
WBC: 5.8 10*3/uL (ref 3.9–10.0)

## 2008-10-15 LAB — COMPREHENSIVE METABOLIC PANEL
ALT: 17 U/L (ref 0–35)
AST: 20 U/L (ref 0–37)
Alkaline Phosphatase: 75 U/L (ref 39–117)
CO2: 20 mEq/L (ref 19–32)
Sodium: 142 mEq/L (ref 135–145)
Total Bilirubin: 0.5 mg/dL (ref 0.3–1.2)
Total Protein: 7.1 g/dL (ref 6.0–8.3)

## 2008-10-29 LAB — CBC WITH DIFFERENTIAL/PLATELET
BASO%: 1.1 % (ref 0.0–2.0)
Basophils Absolute: 0 10*3/uL (ref 0.0–0.1)
EOS%: 2.3 % (ref 0.0–7.0)
HCT: 39.9 % (ref 34.8–46.6)
HGB: 13.4 g/dL (ref 11.6–15.9)
LYMPH%: 24 % (ref 14.0–48.0)
MCH: 32.2 pg (ref 26.0–34.0)
MCHC: 33.5 g/dL (ref 32.0–36.0)
MCV: 96.1 fL (ref 81.0–101.0)
MONO%: 13.1 % — ABNORMAL HIGH (ref 0.0–13.0)
NEUT%: 59.6 % (ref 39.6–76.8)
Platelets: 245 10*3/uL (ref 145–400)

## 2008-10-29 LAB — UA PROTEIN, DIPSTICK - CHCC: Protein, Urine: <30 mg/dL

## 2008-11-08 ENCOUNTER — Ambulatory Visit: Payer: Self-pay | Admitting: Oncology

## 2008-11-12 LAB — CBC WITH DIFFERENTIAL/PLATELET
Basophils Absolute: 0.1 10*3/uL (ref 0.0–0.1)
Eosinophils Absolute: 0.1 10*3/uL (ref 0.0–0.5)
HCT: 39.2 % (ref 34.8–46.6)
MCH: 32.3 pg (ref 26.0–34.0)
MONO#: 0.4 10*3/uL (ref 0.1–0.9)
MONO%: 11.7 % (ref 0.0–13.0)
Platelets: 181 10*3/uL (ref 145–400)
RBC: 4.07 10*6/uL (ref 3.70–5.32)
RDW: 14.9 % — ABNORMAL HIGH (ref 11.3–14.5)
lymph#: 1 10*3/uL (ref 0.9–3.3)

## 2008-11-12 LAB — COMPREHENSIVE METABOLIC PANEL
ALT: 13 U/L (ref 0–35)
AST: 16 U/L (ref 0–37)
Calcium: 8.8 mg/dL (ref 8.4–10.5)
Chloride: 107 mEq/L (ref 96–112)
Creatinine, Ser: 0.89 mg/dL (ref 0.40–1.20)
Total Bilirubin: 0.3 mg/dL (ref 0.3–1.2)

## 2008-11-12 LAB — UA PROTEIN, DIPSTICK - CHCC: Protein, Urine: <30 mg/dL

## 2008-11-21 ENCOUNTER — Encounter: Payer: Self-pay | Admitting: Internal Medicine

## 2008-11-26 LAB — CBC WITH DIFFERENTIAL/PLATELET
BASO%: 0.8 % (ref 0.0–2.0)
Eosinophils Absolute: 0.1 10*3/uL (ref 0.0–0.5)
HCT: 38.8 % (ref 34.8–46.6)
LYMPH%: 26 % (ref 14.0–48.0)
MCHC: 34 g/dL (ref 32.0–36.0)
MCV: 95.3 fL (ref 81.0–101.0)
MONO#: 0.4 10*3/uL (ref 0.1–0.9)
MONO%: 10.4 % (ref 0.0–13.0)
NEUT%: 61.4 % (ref 39.6–76.8)
Platelets: 198 10*3/uL (ref 145–400)
RBC: 4.07 10*6/uL (ref 3.70–5.32)

## 2008-11-26 LAB — COMPREHENSIVE METABOLIC PANEL
ALT: 16 U/L (ref 0–35)
BUN: 6 mg/dL (ref 6–23)
CO2: 30 mEq/L (ref 19–32)
Calcium: 9.1 mg/dL (ref 8.4–10.5)
Chloride: 105 mEq/L (ref 96–112)
Creatinine, Ser: 0.95 mg/dL (ref 0.40–1.20)
Total Bilirubin: 0.7 mg/dL (ref 0.3–1.2)

## 2008-11-26 LAB — UA PROTEIN, DIPSTICK - CHCC: Protein, Urine: <30 mg/dL

## 2008-12-10 LAB — CBC WITH DIFFERENTIAL/PLATELET
Basophils Absolute: 0 10*3/uL (ref 0.0–0.1)
Eosinophils Absolute: 0 10*3/uL (ref 0.0–0.5)
HCT: 39.3 % (ref 34.8–46.6)
HGB: 13.5 g/dL (ref 11.6–15.9)
LYMPH%: 28.5 % (ref 14.0–48.0)
MCHC: 34.3 g/dL (ref 32.0–36.0)
MONO#: 0.4 10*3/uL (ref 0.1–0.9)
NEUT%: 59.4 % (ref 39.6–76.8)
Platelets: 213 10*3/uL (ref 145–400)
WBC: 3.4 10*3/uL — ABNORMAL LOW (ref 3.9–10.0)
lymph#: 1 10*3/uL (ref 0.9–3.3)

## 2008-12-10 LAB — COMPREHENSIVE METABOLIC PANEL
ALT: 17 U/L (ref 0–35)
CO2: 27 mEq/L (ref 19–32)
Calcium: 9.1 mg/dL (ref 8.4–10.5)
Chloride: 105 mEq/L (ref 96–112)
Sodium: 141 mEq/L (ref 135–145)
Total Protein: 7.2 g/dL (ref 6.0–8.3)

## 2008-12-17 LAB — BASIC METABOLIC PANEL
CO2: 25 mEq/L (ref 19–32)
Calcium: 9.4 mg/dL (ref 8.4–10.5)
Creatinine, Ser: 1.1 mg/dL (ref 0.40–1.20)

## 2008-12-24 ENCOUNTER — Encounter: Payer: Self-pay | Admitting: Internal Medicine

## 2009-01-01 ENCOUNTER — Ambulatory Visit: Payer: Self-pay | Admitting: Oncology

## 2009-02-10 LAB — CBC WITH DIFFERENTIAL/PLATELET
BASO%: 0.2 % (ref 0.0–2.0)
EOS%: 2.1 % (ref 0.0–7.0)
HCT: 39.9 % (ref 34.8–46.6)
MCH: 30.8 pg (ref 25.1–34.0)
MCHC: 33.3 g/dL (ref 31.5–36.0)
MONO#: 0.5 10*3/uL (ref 0.1–0.9)
RBC: 4.32 10*6/uL (ref 3.70–5.45)
RDW: 13.3 % (ref 11.2–14.5)
WBC: 4.4 10*3/uL (ref 3.9–10.3)
lymph#: 1.4 10*3/uL (ref 0.9–3.3)
nRBC: 0 % (ref 0–0)

## 2009-02-10 LAB — COMPREHENSIVE METABOLIC PANEL
ALT: 26 U/L (ref 0–35)
AST: 37 U/L (ref 0–37)
Alkaline Phosphatase: 105 U/L (ref 39–117)
CO2: 28 mEq/L (ref 19–32)
Creatinine, Ser: 0.87 mg/dL (ref 0.40–1.20)
Sodium: 142 mEq/L (ref 135–145)
Total Bilirubin: 0.5 mg/dL (ref 0.3–1.2)
Total Protein: 7 g/dL (ref 6.0–8.3)

## 2009-02-10 LAB — MAGNESIUM: Magnesium: 2.1 mg/dL (ref 1.5–2.5)

## 2009-02-20 ENCOUNTER — Ambulatory Visit: Payer: Self-pay | Admitting: Oncology

## 2009-02-24 LAB — COMPREHENSIVE METABOLIC PANEL
ALT: 27 U/L (ref 0–35)
Albumin: 3.9 g/dL (ref 3.5–5.2)
CO2: 24 mEq/L (ref 19–32)
Glucose, Bld: 113 mg/dL — ABNORMAL HIGH (ref 70–99)
Potassium: 3.7 mEq/L (ref 3.5–5.3)
Sodium: 143 mEq/L (ref 135–145)
Total Bilirubin: 0.4 mg/dL (ref 0.3–1.2)
Total Protein: 6.9 g/dL (ref 6.0–8.3)

## 2009-02-24 LAB — CBC WITH DIFFERENTIAL/PLATELET
Basophils Absolute: 0 10*3/uL (ref 0.0–0.1)
Eosinophils Absolute: 0.1 10*3/uL (ref 0.0–0.5)
HGB: 14.1 g/dL (ref 11.6–15.9)
MCV: 92.3 fL (ref 79.5–101.0)
MONO#: 0.5 10*3/uL (ref 0.1–0.9)
MONO%: 9 % (ref 0.0–14.0)
NEUT#: 3.1 10*3/uL (ref 1.5–6.5)
RBC: 4.53 10*6/uL (ref 3.70–5.45)
RDW: 13.9 % (ref 11.2–14.5)
WBC: 5 10*3/uL (ref 3.9–10.3)
lymph#: 1.4 10*3/uL (ref 0.9–3.3)
nRBC: 0 % (ref 0–0)

## 2009-02-24 LAB — MAGNESIUM: Magnesium: 1.6 mg/dL (ref 1.5–2.5)

## 2009-03-03 LAB — COMPREHENSIVE METABOLIC PANEL
ALT: 24 U/L (ref 0–35)
AST: 32 U/L (ref 0–37)
Alkaline Phosphatase: 95 U/L (ref 39–117)
Potassium: 4 mEq/L (ref 3.5–5.3)
Sodium: 142 mEq/L (ref 135–145)
Total Bilirubin: 0.7 mg/dL (ref 0.3–1.2)
Total Protein: 6.8 g/dL (ref 6.0–8.3)

## 2009-03-03 LAB — CBC WITH DIFFERENTIAL/PLATELET
EOS%: 1.9 % (ref 0.0–7.0)
Eosinophils Absolute: 0.1 10*3/uL (ref 0.0–0.5)
LYMPH%: 33.7 % (ref 14.0–49.7)
MCH: 31.1 pg (ref 25.1–34.0)
MCHC: 33.2 g/dL (ref 31.5–36.0)
MCV: 93.6 fL (ref 79.5–101.0)
MONO%: 8.9 % (ref 0.0–14.0)
NEUT#: 2.3 10*3/uL (ref 1.5–6.5)
Platelets: 198 10*3/uL (ref 145–400)
RBC: 4.86 10*6/uL (ref 3.70–5.45)
nRBC: 0 % (ref 0–0)

## 2009-03-24 LAB — COMPREHENSIVE METABOLIC PANEL
ALT: 26 U/L (ref 0–35)
Albumin: 3.7 g/dL (ref 3.5–5.2)
Alkaline Phosphatase: 88 U/L (ref 39–117)
CO2: 27 mEq/L (ref 19–32)
Glucose, Bld: 106 mg/dL — ABNORMAL HIGH (ref 70–99)
Potassium: 3.8 mEq/L (ref 3.5–5.3)
Sodium: 139 mEq/L (ref 135–145)
Total Protein: 7.7 g/dL (ref 6.0–8.3)

## 2009-03-24 LAB — CBC WITH DIFFERENTIAL/PLATELET
BASO%: 0.5 % (ref 0.0–2.0)
EOS%: 2 % (ref 0.0–7.0)
HCT: 43.3 % (ref 34.8–46.6)
LYMPH%: 30.2 % (ref 14.0–49.7)
MCH: 32.2 pg (ref 25.1–34.0)
MCHC: 34.6 g/dL (ref 31.5–36.0)
MONO%: 10.4 % (ref 0.0–14.0)
NEUT%: 56.9 % (ref 38.4–76.8)
Platelets: 187 10*3/uL (ref 145–400)

## 2009-04-03 ENCOUNTER — Ambulatory Visit: Payer: Self-pay | Admitting: Oncology

## 2009-04-07 LAB — CBC WITH DIFFERENTIAL/PLATELET
BASO%: 0.2 % (ref 0.0–2.0)
EOS%: 1.6 % (ref 0.0–7.0)
Eosinophils Absolute: 0.1 10*3/uL (ref 0.0–0.5)
MCHC: 34.4 g/dL (ref 31.5–36.0)
MCV: 94.2 fL (ref 79.5–101.0)
MONO%: 9.9 % (ref 0.0–14.0)
NEUT#: 3.1 10*3/uL (ref 1.5–6.5)
RBC: 4.51 10*6/uL (ref 3.70–5.45)
RDW: 16.5 % — ABNORMAL HIGH (ref 11.2–14.5)

## 2009-04-21 LAB — CBC WITH DIFFERENTIAL/PLATELET
Basophils Absolute: 0 10*3/uL (ref 0.0–0.1)
EOS%: 0.9 % (ref 0.0–7.0)
Eosinophils Absolute: 0 10*3/uL (ref 0.0–0.5)
HGB: 14.4 g/dL (ref 11.6–15.9)
LYMPH%: 23.4 % (ref 14.0–49.7)
MCH: 32.8 pg (ref 25.1–34.0)
MCV: 94.8 fL (ref 79.5–101.0)
MONO%: 8.9 % (ref 0.0–14.0)
NEUT#: 2.9 10*3/uL (ref 1.5–6.5)
Platelets: 178 10*3/uL (ref 145–400)
RBC: 4.39 10*6/uL (ref 3.70–5.45)

## 2009-05-05 ENCOUNTER — Ambulatory Visit (HOSPITAL_COMMUNITY): Admission: RE | Admit: 2009-05-05 | Discharge: 2009-05-05 | Payer: Self-pay | Admitting: Oncology

## 2009-05-09 ENCOUNTER — Ambulatory Visit: Payer: Self-pay | Admitting: Oncology

## 2009-05-14 LAB — COMPREHENSIVE METABOLIC PANEL
Alkaline Phosphatase: 123 U/L — ABNORMAL HIGH (ref 39–117)
BUN: 5 mg/dL — ABNORMAL LOW (ref 6–23)
Glucose, Bld: 94 mg/dL (ref 70–99)
Sodium: 138 mEq/L (ref 135–145)
Total Bilirubin: 0.8 mg/dL (ref 0.3–1.2)

## 2009-05-14 LAB — CBC WITH DIFFERENTIAL/PLATELET
Eosinophils Absolute: 0 10*3/uL (ref 0.0–0.5)
LYMPH%: 14.4 % (ref 14.0–49.7)
MCH: 33 pg (ref 25.1–34.0)
MCV: 96.8 fL (ref 79.5–101.0)
MONO%: 7.1 % (ref 0.0–14.0)
NEUT#: 4.9 10*3/uL (ref 1.5–6.5)
Platelets: 179 10*3/uL (ref 145–400)
RBC: 4.42 10*6/uL (ref 3.70–5.45)

## 2009-05-16 ENCOUNTER — Ambulatory Visit (HOSPITAL_COMMUNITY): Admission: RE | Admit: 2009-05-16 | Discharge: 2009-05-16 | Payer: Self-pay | Admitting: Oncology

## 2009-06-09 LAB — COMPREHENSIVE METABOLIC PANEL
AST: 36 U/L (ref 0–37)
Alkaline Phosphatase: 131 U/L — ABNORMAL HIGH (ref 39–117)
Glucose, Bld: 133 mg/dL — ABNORMAL HIGH (ref 70–99)
Potassium: 3.5 mEq/L (ref 3.5–5.3)
Sodium: 139 mEq/L (ref 135–145)
Total Bilirubin: 1 mg/dL (ref 0.3–1.2)
Total Protein: 7.7 g/dL (ref 6.0–8.3)

## 2009-06-09 LAB — CBC WITH DIFFERENTIAL/PLATELET
BASO%: 0.2 % (ref 0.0–2.0)
Basophils Absolute: 0 10*3/uL (ref 0.0–0.1)
EOS%: 1.3 % (ref 0.0–7.0)
MCH: 33.1 pg (ref 25.1–34.0)
MCHC: 33.9 g/dL (ref 31.5–36.0)
MCV: 97.5 fL (ref 79.5–101.0)
MONO%: 13.5 % (ref 0.0–14.0)
RBC: 4.44 10*6/uL (ref 3.70–5.45)
RDW: 14.4 % (ref 11.2–14.5)
lymph#: 1.4 10*3/uL (ref 0.9–3.3)

## 2009-06-09 LAB — MAGNESIUM: Magnesium: 1.6 mg/dL (ref 1.5–2.5)

## 2009-06-19 ENCOUNTER — Ambulatory Visit: Payer: Self-pay | Admitting: Oncology

## 2009-07-07 LAB — CBC WITH DIFFERENTIAL/PLATELET
Basophils Absolute: 0 10*3/uL (ref 0.0–0.1)
Eosinophils Absolute: 0.1 10*3/uL (ref 0.0–0.5)
HCT: 43.3 % (ref 34.8–46.6)
HGB: 14.9 g/dL (ref 11.6–15.9)
LYMPH%: 24.2 % (ref 14.0–49.7)
MCV: 95.4 fL (ref 79.5–101.0)
MONO%: 10.8 % (ref 0.0–14.0)
NEUT#: 3.6 10*3/uL (ref 1.5–6.5)
NEUT%: 63.4 % (ref 38.4–76.8)
Platelets: 172 10*3/uL (ref 145–400)
RDW: 14.3 % (ref 11.2–14.5)

## 2009-07-07 LAB — COMPREHENSIVE METABOLIC PANEL
Alkaline Phosphatase: 133 U/L — ABNORMAL HIGH (ref 39–117)
BUN: 6 mg/dL (ref 6–23)
Creatinine, Ser: 0.73 mg/dL (ref 0.40–1.20)
Glucose, Bld: 97 mg/dL (ref 70–99)
Total Bilirubin: 0.6 mg/dL (ref 0.3–1.2)

## 2009-07-21 ENCOUNTER — Ambulatory Visit: Payer: Self-pay | Admitting: Oncology

## 2009-08-04 LAB — CBC WITH DIFFERENTIAL/PLATELET
BASO%: 0.2 % (ref 0.0–2.0)
Eosinophils Absolute: 0.1 10*3/uL (ref 0.0–0.5)
MCHC: 34.3 g/dL (ref 31.5–36.0)
MONO#: 0.5 10*3/uL (ref 0.1–0.9)
NEUT#: 3.8 10*3/uL (ref 1.5–6.5)
Platelets: 177 10*3/uL (ref 145–400)
RBC: 4.7 10*6/uL (ref 3.70–5.45)
WBC: 5.7 10*3/uL (ref 3.9–10.3)
lymph#: 1.3 10*3/uL (ref 0.9–3.3)

## 2009-08-04 LAB — COMPREHENSIVE METABOLIC PANEL
AST: 40 U/L — ABNORMAL HIGH (ref 0–37)
Albumin: 3.3 g/dL — ABNORMAL LOW (ref 3.5–5.2)
Alkaline Phosphatase: 135 U/L — ABNORMAL HIGH (ref 39–117)
Calcium: 9.1 mg/dL (ref 8.4–10.5)
Chloride: 104 mEq/L (ref 96–112)
Potassium: 3.4 mEq/L — ABNORMAL LOW (ref 3.5–5.3)
Sodium: 140 mEq/L (ref 135–145)
Total Protein: 7.9 g/dL (ref 6.0–8.3)

## 2009-08-28 ENCOUNTER — Ambulatory Visit: Payer: Self-pay | Admitting: Oncology

## 2009-09-01 LAB — CBC WITH DIFFERENTIAL/PLATELET
Basophils Absolute: 0 10*3/uL (ref 0.0–0.1)
EOS%: 0.8 % (ref 0.0–7.0)
Eosinophils Absolute: 0.1 10*3/uL (ref 0.0–0.5)
HCT: 44.8 % (ref 34.8–46.6)
HGB: 15.5 g/dL (ref 11.6–15.9)
MCH: 32.4 pg (ref 25.1–34.0)
MCV: 93.7 fL (ref 79.5–101.0)
MONO%: 11.3 % (ref 0.0–14.0)
NEUT#: 5.1 10*3/uL (ref 1.5–6.5)
NEUT%: 68.6 % (ref 38.4–76.8)
Platelets: 187 10*3/uL (ref 145–400)
RDW: 15.5 % — ABNORMAL HIGH (ref 11.2–14.5)

## 2009-09-01 LAB — COMPREHENSIVE METABOLIC PANEL
Albumin: 3.7 g/dL (ref 3.5–5.2)
Alkaline Phosphatase: 151 U/L — ABNORMAL HIGH (ref 39–117)
BUN: 10 mg/dL (ref 6–23)
CO2: 24 mEq/L (ref 19–32)
Glucose, Bld: 98 mg/dL (ref 70–99)
Potassium: 4 mEq/L (ref 3.5–5.3)
Sodium: 140 mEq/L (ref 135–145)
Total Bilirubin: 0.7 mg/dL (ref 0.3–1.2)
Total Protein: 7.6 g/dL (ref 6.0–8.3)

## 2009-09-01 LAB — MAGNESIUM: Magnesium: 1.4 mg/dL — ABNORMAL LOW (ref 1.5–2.5)

## 2009-09-24 ENCOUNTER — Ambulatory Visit: Payer: Self-pay | Admitting: Oncology

## 2009-09-29 LAB — CBC WITH DIFFERENTIAL/PLATELET
Basophils Absolute: 0 10*3/uL (ref 0.0–0.1)
Eosinophils Absolute: 0.1 10*3/uL (ref 0.0–0.5)
HCT: 44.2 % (ref 34.8–46.6)
HGB: 15.2 g/dL (ref 11.6–15.9)
LYMPH%: 21 % (ref 14.0–49.7)
MCHC: 34.4 g/dL (ref 31.5–36.0)
MONO#: 0.4 10*3/uL (ref 0.1–0.9)
NEUT#: 3.9 10*3/uL (ref 1.5–6.5)
NEUT%: 69.9 % (ref 38.4–76.8)
Platelets: 166 10*3/uL (ref 145–400)
WBC: 5.6 10*3/uL (ref 3.9–10.3)
lymph#: 1.2 10*3/uL (ref 0.9–3.3)

## 2009-09-29 LAB — COMPREHENSIVE METABOLIC PANEL
AST: 33 U/L (ref 0–37)
Albumin: 3.8 g/dL (ref 3.5–5.2)
Alkaline Phosphatase: 140 U/L — ABNORMAL HIGH (ref 39–117)
Potassium: 3.6 mEq/L (ref 3.5–5.3)
Sodium: 142 mEq/L (ref 135–145)
Total Protein: 7.3 g/dL (ref 6.0–8.3)

## 2009-10-23 ENCOUNTER — Ambulatory Visit: Payer: Self-pay | Admitting: Oncology

## 2009-10-27 LAB — CBC WITH DIFFERENTIAL/PLATELET
BASO%: 0.2 % (ref 0.0–2.0)
EOS%: 0.9 % (ref 0.0–7.0)
HCT: 45.1 % (ref 34.8–46.6)
LYMPH%: 21 % (ref 14.0–49.7)
MCH: 33.1 pg (ref 25.1–34.0)
MCHC: 33.7 g/dL (ref 31.5–36.0)
NEUT%: 67.9 % (ref 38.4–76.8)
lymph#: 1.2 10*3/uL (ref 0.9–3.3)

## 2009-10-27 LAB — COMPREHENSIVE METABOLIC PANEL
ALT: 28 U/L (ref 0–35)
AST: 43 U/L — ABNORMAL HIGH (ref 0–37)
Calcium: 9.2 mg/dL (ref 8.4–10.5)
Chloride: 106 mEq/L (ref 96–112)
Creatinine, Ser: 0.78 mg/dL (ref 0.40–1.20)
Potassium: 3.9 mEq/L (ref 3.5–5.3)
Sodium: 138 mEq/L (ref 135–145)
Total Protein: 8 g/dL (ref 6.0–8.3)

## 2009-10-27 LAB — MAGNESIUM: Magnesium: 1.4 mg/dL — ABNORMAL LOW (ref 1.5–2.5)

## 2009-11-10 LAB — CBC WITH DIFFERENTIAL/PLATELET
BASO%: 0.4 % (ref 0.0–2.0)
EOS%: 1.5 % (ref 0.0–7.0)
HGB: 15 g/dL (ref 11.6–15.9)
MCH: 33.6 pg (ref 25.1–34.0)
MCHC: 34 g/dL (ref 31.5–36.0)
RDW: 15.4 % — ABNORMAL HIGH (ref 11.2–14.5)
lymph#: 1.6 10*3/uL (ref 0.9–3.3)

## 2009-11-20 ENCOUNTER — Ambulatory Visit: Payer: Self-pay | Admitting: Oncology

## 2009-11-20 ENCOUNTER — Ambulatory Visit (HOSPITAL_COMMUNITY): Admission: RE | Admit: 2009-11-20 | Discharge: 2009-11-20 | Payer: Self-pay | Admitting: Oncology

## 2009-11-24 LAB — CBC WITH DIFFERENTIAL/PLATELET
BASO%: 0.2 % (ref 0.0–2.0)
EOS%: 1.2 % (ref 0.0–7.0)
HCT: 44.7 % (ref 34.8–46.6)
LYMPH%: 32.4 % (ref 14.0–49.7)
MCH: 33.3 pg (ref 25.1–34.0)
MCHC: 34 g/dL (ref 31.5–36.0)
MCV: 97.8 fL (ref 79.5–101.0)
MONO%: 8 % (ref 0.0–14.0)
NEUT%: 58.2 % (ref 38.4–76.8)
Platelets: 151 10*3/uL (ref 145–400)

## 2009-11-24 LAB — COMPREHENSIVE METABOLIC PANEL
AST: 38 U/L — ABNORMAL HIGH (ref 0–37)
Alkaline Phosphatase: 130 U/L — ABNORMAL HIGH (ref 39–117)
BUN: 7 mg/dL (ref 6–23)
Calcium: 8.4 mg/dL (ref 8.4–10.5)
Chloride: 107 mEq/L (ref 96–112)
Creatinine, Ser: 0.71 mg/dL (ref 0.40–1.20)

## 2009-11-24 LAB — MAGNESIUM: Magnesium: 1.4 mg/dL — ABNORMAL LOW (ref 1.5–2.5)

## 2009-12-16 LAB — COMPREHENSIVE METABOLIC PANEL
AST: 32 U/L (ref 0–37)
Alkaline Phosphatase: 171 U/L — ABNORMAL HIGH (ref 39–117)
BUN: 11 mg/dL (ref 6–23)
Creatinine, Ser: 0.91 mg/dL (ref 0.40–1.20)

## 2009-12-23 ENCOUNTER — Ambulatory Visit: Payer: Self-pay | Admitting: Oncology

## 2010-02-27 ENCOUNTER — Ambulatory Visit: Payer: Self-pay | Admitting: Oncology

## 2010-02-27 LAB — COMPREHENSIVE METABOLIC PANEL
BUN: 9 mg/dL (ref 6–23)
CO2: 29 mEq/L (ref 19–32)
Creatinine, Ser: 0.83 mg/dL (ref 0.40–1.20)
Glucose, Bld: 129 mg/dL — ABNORMAL HIGH (ref 70–99)
Total Bilirubin: 0.9 mg/dL (ref 0.3–1.2)

## 2010-02-27 LAB — CBC WITH DIFFERENTIAL/PLATELET
BASO%: 0.4 % (ref 0.0–2.0)
Basophils Absolute: 0 10*3/uL (ref 0.0–0.1)
HCT: 39.5 % (ref 34.8–46.6)
HGB: 13.6 g/dL (ref 11.6–15.9)
LYMPH%: 24.4 % (ref 14.0–49.7)
MCHC: 34.6 g/dL (ref 31.5–36.0)
MONO#: 0.5 10*3/uL (ref 0.1–0.9)
NEUT%: 64.4 % (ref 38.4–76.8)
Platelets: 203 10*3/uL (ref 145–400)
WBC: 5.6 10*3/uL (ref 3.9–10.3)

## 2010-02-27 LAB — MAGNESIUM: Magnesium: 1.6 mg/dL (ref 1.5–2.5)

## 2010-04-22 ENCOUNTER — Ambulatory Visit: Payer: Self-pay | Admitting: Oncology

## 2010-06-03 ENCOUNTER — Ambulatory Visit: Payer: Self-pay | Admitting: Oncology

## 2010-06-18 LAB — CBC WITH DIFFERENTIAL/PLATELET
BASO%: 0.3 % (ref 0.0–2.0)
MCHC: 33.8 g/dL (ref 31.5–36.0)
MONO#: 0.6 10*3/uL (ref 0.1–0.9)
RBC: 4.46 10*6/uL (ref 3.70–5.45)
WBC: 8.6 10*3/uL (ref 3.9–10.3)
lymph#: 0.9 10*3/uL (ref 0.9–3.3)

## 2010-06-18 LAB — COMPREHENSIVE METABOLIC PANEL
ALT: 30 U/L (ref 0–35)
CO2: 23 mEq/L (ref 19–32)
Calcium: 9.2 mg/dL (ref 8.4–10.5)
Chloride: 102 mEq/L (ref 96–112)
Sodium: 136 mEq/L (ref 135–145)
Total Protein: 8.1 g/dL (ref 6.0–8.3)

## 2010-06-18 LAB — MAGNESIUM: Magnesium: 2 mg/dL (ref 1.5–2.5)

## 2010-06-29 LAB — COMPREHENSIVE METABOLIC PANEL
ALT: 67 U/L — ABNORMAL HIGH (ref 0–35)
AST: 75 U/L — ABNORMAL HIGH (ref 0–37)
CO2: 26 mEq/L (ref 19–32)
Creatinine, Ser: 1.08 mg/dL (ref 0.40–1.20)
Total Bilirubin: 2.8 mg/dL — ABNORMAL HIGH (ref 0.3–1.2)

## 2010-07-08 ENCOUNTER — Encounter: Admission: RE | Admit: 2010-07-08 | Discharge: 2010-07-08 | Payer: Self-pay | Admitting: Oncology

## 2010-07-13 ENCOUNTER — Ambulatory Visit (HOSPITAL_COMMUNITY): Admission: RE | Admit: 2010-07-13 | Discharge: 2010-07-13 | Payer: Self-pay | Admitting: Interventional Radiology

## 2010-07-14 ENCOUNTER — Ambulatory Visit: Payer: Self-pay | Admitting: Oncology

## 2010-07-16 ENCOUNTER — Encounter: Payer: Self-pay | Admitting: Internal Medicine

## 2010-07-16 ENCOUNTER — Ambulatory Visit (HOSPITAL_COMMUNITY): Admission: RE | Admit: 2010-07-16 | Discharge: 2010-07-16 | Payer: Self-pay | Admitting: Oncology

## 2010-07-16 LAB — COMPREHENSIVE METABOLIC PANEL
AST: 86 U/L — ABNORMAL HIGH (ref 0–37)
Albumin: 2.5 g/dL — ABNORMAL LOW (ref 3.5–5.2)
Alkaline Phosphatase: 357 U/L — ABNORMAL HIGH (ref 39–117)
BUN: 10 mg/dL (ref 6–23)
Calcium: 9.1 mg/dL (ref 8.4–10.5)
Creatinine, Ser: 1.04 mg/dL (ref 0.40–1.20)
Glucose, Bld: 126 mg/dL — ABNORMAL HIGH (ref 70–99)
Potassium: 3.8 mEq/L (ref 3.5–5.3)

## 2010-07-17 ENCOUNTER — Encounter: Payer: Self-pay | Admitting: Internal Medicine

## 2010-07-20 LAB — COMPREHENSIVE METABOLIC PANEL
ALT: 38 U/L — ABNORMAL HIGH (ref 0–35)
AST: 55 U/L — ABNORMAL HIGH (ref 0–37)
Albumin: 2.4 g/dL — ABNORMAL LOW (ref 3.5–5.2)
BUN: 10 mg/dL (ref 6–23)
CO2: 27 mEq/L (ref 19–32)
Calcium: 9.2 mg/dL (ref 8.4–10.5)
Chloride: 96 mEq/L (ref 96–112)
Creatinine, Ser: 1.13 mg/dL (ref 0.40–1.20)
Potassium: 4.3 mEq/L (ref 3.5–5.3)

## 2010-07-23 LAB — CBC WITH DIFFERENTIAL/PLATELET
BASO%: 0 % (ref 0.0–2.0)
EOS%: 0.4 % (ref 0.0–7.0)
MCH: 31 pg (ref 25.1–34.0)
MCHC: 34.3 g/dL (ref 31.5–36.0)
MONO#: 0.2 10*3/uL (ref 0.1–0.9)
RDW: 16.4 % — ABNORMAL HIGH (ref 11.2–14.5)
WBC: 11.1 10*3/uL — ABNORMAL HIGH (ref 3.9–10.3)
lymph#: 0.8 10*3/uL — ABNORMAL LOW (ref 0.9–3.3)

## 2010-08-05 LAB — CBC WITH DIFFERENTIAL/PLATELET
Basophils Absolute: 0 10*3/uL (ref 0.0–0.1)
EOS%: 1.3 % (ref 0.0–7.0)
Eosinophils Absolute: 0 10*3/uL (ref 0.0–0.5)
HCT: 34 % — ABNORMAL LOW (ref 34.8–46.6)
HGB: 11.7 g/dL (ref 11.6–15.9)
MONO#: 0.4 10*3/uL (ref 0.1–0.9)
NEUT#: 2.1 10*3/uL (ref 1.5–6.5)
RDW: 15.9 % — ABNORMAL HIGH (ref 11.2–14.5)
WBC: 3.7 10*3/uL — ABNORMAL LOW (ref 3.9–10.3)
lymph#: 1 10*3/uL (ref 0.9–3.3)

## 2010-08-05 LAB — COMPREHENSIVE METABOLIC PANEL
AST: 50 U/L — ABNORMAL HIGH (ref 0–37)
Albumin: 2.8 g/dL — ABNORMAL LOW (ref 3.5–5.2)
BUN: 10 mg/dL (ref 6–23)
CO2: 26 mEq/L (ref 19–32)
Calcium: 9.1 mg/dL (ref 8.4–10.5)
Chloride: 105 mEq/L (ref 96–112)
Glucose, Bld: 131 mg/dL — ABNORMAL HIGH (ref 70–99)
Potassium: 3.5 mEq/L (ref 3.5–5.3)

## 2010-08-12 LAB — CBC WITH DIFFERENTIAL/PLATELET
BASO%: 0.3 % (ref 0.0–2.0)
Basophils Absolute: 0 10*3/uL (ref 0.0–0.1)
EOS%: 0.7 % (ref 0.0–7.0)
HGB: 11.6 g/dL (ref 11.6–15.9)
MCH: 30.1 pg (ref 25.1–34.0)
MCHC: 32.6 g/dL (ref 31.5–36.0)
RDW: 15.9 % — ABNORMAL HIGH (ref 11.2–14.5)
WBC: 6.9 10*3/uL (ref 3.9–10.3)
lymph#: 1.2 10*3/uL (ref 0.9–3.3)

## 2010-08-12 LAB — UA PROTEIN, DIPSTICK - CHCC: Protein, Urine: 30 mg/dL

## 2010-08-14 ENCOUNTER — Ambulatory Visit: Payer: Self-pay | Admitting: Oncology

## 2010-09-08 LAB — CBC WITH DIFFERENTIAL/PLATELET
BASO%: 0.6 % (ref 0.0–2.0)
Basophils Absolute: 0 10*3/uL (ref 0.0–0.1)
EOS%: 1 % (ref 0.0–7.0)
HCT: 36.4 % (ref 34.8–46.6)
HGB: 12.1 g/dL (ref 11.6–15.9)
LYMPH%: 27.9 % (ref 14.0–49.7)
MCH: 31 pg (ref 25.1–34.0)
MCHC: 33.2 g/dL (ref 31.5–36.0)
MCV: 93.3 fL (ref 79.5–101.0)
NEUT%: 61.3 % (ref 38.4–76.8)
Platelets: 181 10*3/uL (ref 145–400)
lymph#: 1.4 10*3/uL (ref 0.9–3.3)

## 2010-09-08 LAB — UA PROTEIN, DIPSTICK - CHCC: Protein, Urine: <30 mg/dL

## 2010-09-08 LAB — COMPREHENSIVE METABOLIC PANEL
ALT: 27 U/L (ref 0–35)
AST: 40 U/L — ABNORMAL HIGH (ref 0–37)
Alkaline Phosphatase: 274 U/L — ABNORMAL HIGH (ref 39–117)
CO2: 26 mEq/L (ref 19–32)
Creatinine, Ser: 0.86 mg/dL (ref 0.40–1.20)
Sodium: 140 mEq/L (ref 135–145)
Total Bilirubin: 0.6 mg/dL (ref 0.3–1.2)
Total Protein: 7.8 g/dL (ref 6.0–8.3)

## 2010-09-18 ENCOUNTER — Ambulatory Visit (HOSPITAL_COMMUNITY): Admission: RE | Admit: 2010-09-18 | Discharge: 2010-09-18 | Payer: Self-pay | Admitting: Family Medicine

## 2010-09-18 ENCOUNTER — Ambulatory Visit: Payer: Self-pay | Admitting: Oncology

## 2010-09-22 LAB — COMPREHENSIVE METABOLIC PANEL
Albumin: 3.2 g/dL — ABNORMAL LOW (ref 3.5–5.2)
CO2: 26 mEq/L (ref 19–32)
Calcium: 9.1 mg/dL (ref 8.4–10.5)
Chloride: 109 mEq/L (ref 96–112)
Glucose, Bld: 90 mg/dL (ref 70–99)
Potassium: 4 mEq/L (ref 3.5–5.3)
Sodium: 140 mEq/L (ref 135–145)
Total Protein: 8.1 g/dL (ref 6.0–8.3)

## 2010-09-22 LAB — CBC WITH DIFFERENTIAL/PLATELET
BASO%: 0.2 % (ref 0.0–2.0)
LYMPH%: 29.5 % (ref 14.0–49.7)
MCHC: 33.2 g/dL (ref 31.5–36.0)
MONO#: 0.7 10*3/uL (ref 0.1–0.9)
Platelets: 224 10*3/uL (ref 145–400)
RBC: 3.93 10*6/uL (ref 3.70–5.45)
WBC: 5 10*3/uL (ref 3.9–10.3)
lymph#: 1.5 10*3/uL (ref 0.9–3.3)
nRBC: 0 % (ref 0–0)

## 2010-09-22 LAB — UA PROTEIN, DIPSTICK - CHCC: Protein, Urine: NEGATIVE mg/dL

## 2010-10-05 LAB — CBC WITH DIFFERENTIAL/PLATELET
BASO%: 0.2 % (ref 0.0–2.0)
EOS%: 1.3 % (ref 0.0–7.0)
HCT: 37.2 % (ref 34.8–46.6)
LYMPH%: 28.3 % (ref 14.0–49.7)
MCH: 31.6 pg (ref 25.1–34.0)
MCHC: 33.6 g/dL (ref 31.5–36.0)
MCV: 93.9 fL (ref 79.5–101.0)
MONO%: 11.8 % (ref 0.0–14.0)
NEUT%: 58.4 % (ref 38.4–76.8)
Platelets: 235 10*3/uL (ref 145–400)
lymph#: 1.5 10*3/uL (ref 0.9–3.3)

## 2010-10-05 LAB — COMPREHENSIVE METABOLIC PANEL
ALT: 26 U/L (ref 0–35)
AST: 39 U/L — ABNORMAL HIGH (ref 0–37)
Calcium: 9.1 mg/dL (ref 8.4–10.5)
Chloride: 108 mEq/L (ref 96–112)
Creatinine, Ser: 0.89 mg/dL (ref 0.40–1.20)

## 2010-10-13 ENCOUNTER — Ambulatory Visit (HOSPITAL_COMMUNITY): Admission: RE | Admit: 2010-10-13 | Discharge: 2010-10-13 | Payer: Self-pay | Admitting: Oncology

## 2010-10-19 ENCOUNTER — Other Ambulatory Visit: Payer: Self-pay | Admitting: Oncology

## 2010-10-19 LAB — CBC WITH DIFFERENTIAL/PLATELET
Basophils Absolute: 0 10*3/uL (ref 0.0–0.1)
Eosinophils Absolute: 0 10*3/uL (ref 0.0–0.5)
HCT: 37.7 % (ref 34.8–46.6)
LYMPH%: 29 % (ref 14.0–49.7)
MCV: 95 fL (ref 79.5–101.0)
MONO#: 0.6 10*3/uL (ref 0.1–0.9)
NEUT#: 3 10*3/uL (ref 1.5–6.5)
NEUT%: 58.1 % (ref 38.4–76.8)
Platelets: 185 10*3/uL (ref 145–400)
WBC: 5.1 10*3/uL (ref 3.9–10.3)

## 2010-10-19 LAB — COMPREHENSIVE METABOLIC PANEL
Alkaline Phosphatase: 193 U/L — ABNORMAL HIGH (ref 39–117)
CO2: 19 mEq/L (ref 19–32)
Creatinine, Ser: 0.62 mg/dL (ref 0.40–1.20)
Glucose, Bld: 82 mg/dL (ref 70–99)
Total Bilirubin: 0.6 mg/dL (ref 0.3–1.2)

## 2010-11-02 ENCOUNTER — Other Ambulatory Visit: Payer: Self-pay | Admitting: Oncology

## 2010-11-02 LAB — CBC WITH DIFFERENTIAL/PLATELET
Basophils Absolute: 0 10*3/uL (ref 0.0–0.1)
Eosinophils Absolute: 0.1 10*3/uL (ref 0.0–0.5)
HCT: 37.2 % (ref 34.8–46.6)
HGB: 12.5 g/dL (ref 11.6–15.9)
LYMPH%: 25.1 % (ref 14.0–49.7)
MCV: 96.2 fL (ref 79.5–101.0)
MONO#: 0.5 10*3/uL (ref 0.1–0.9)
MONO%: 11 % (ref 0.0–14.0)
NEUT#: 2.8 10*3/uL (ref 1.5–6.5)
NEUT%: 62.2 % (ref 38.4–76.8)
Platelets: 249 10*3/uL (ref 145–400)

## 2010-11-02 LAB — COMPREHENSIVE METABOLIC PANEL
Albumin: 3 g/dL — ABNORMAL LOW (ref 3.5–5.2)
Alkaline Phosphatase: 282 U/L — ABNORMAL HIGH (ref 39–117)
CO2: 26 mEq/L (ref 19–32)
Calcium: 9.4 mg/dL (ref 8.4–10.5)
Chloride: 107 mEq/L (ref 96–112)
Glucose, Bld: 109 mg/dL — ABNORMAL HIGH (ref 70–99)
Potassium: 4.1 mEq/L (ref 3.5–5.3)
Sodium: 141 mEq/L (ref 135–145)
Total Protein: 7.6 g/dL (ref 6.0–8.3)

## 2010-11-16 ENCOUNTER — Other Ambulatory Visit: Payer: Self-pay | Admitting: Oncology

## 2010-11-16 LAB — CBC WITH DIFFERENTIAL/PLATELET
BASO%: 0.2 % (ref 0.0–2.0)
Eosinophils Absolute: 0 10*3/uL (ref 0.0–0.5)
HCT: 39.6 % (ref 34.8–46.6)
LYMPH%: 24.3 % (ref 14.0–49.7)
MONO#: 0.6 10*3/uL (ref 0.1–0.9)
NEUT#: 2.8 10*3/uL (ref 1.5–6.5)
NEUT%: 61.8 % (ref 38.4–76.8)
Platelets: 204 10*3/uL (ref 145–400)
RBC: 4.12 10*6/uL (ref 3.70–5.45)
WBC: 4.5 10*3/uL (ref 3.9–10.3)
lymph#: 1.1 10*3/uL (ref 0.9–3.3)
nRBC: 0 % (ref 0–0)

## 2010-11-16 LAB — COMPREHENSIVE METABOLIC PANEL
CO2: 22 mEq/L (ref 19–32)
Calcium: 9.3 mg/dL (ref 8.4–10.5)
Chloride: 108 mEq/L (ref 96–112)
Creatinine, Ser: 0.92 mg/dL (ref 0.40–1.20)
Glucose, Bld: 103 mg/dL — ABNORMAL HIGH (ref 70–99)
Total Bilirubin: 2.4 mg/dL — ABNORMAL HIGH (ref 0.3–1.2)

## 2010-11-18 ENCOUNTER — Ambulatory Visit: Payer: Self-pay | Admitting: Oncology

## 2010-11-30 LAB — CBC WITH DIFFERENTIAL/PLATELET
BASO%: 0.3 % (ref 0.0–2.0)
Eosinophils Absolute: 0 10*3/uL (ref 0.0–0.5)
MCHC: 33 g/dL (ref 31.5–36.0)
MONO#: 0.4 10*3/uL (ref 0.1–0.9)
NEUT#: 1.4 10*3/uL — ABNORMAL LOW (ref 1.5–6.5)
RBC: 3.68 10*6/uL — ABNORMAL LOW (ref 3.70–5.45)
WBC: 3 10*3/uL — ABNORMAL LOW (ref 3.9–10.3)
lymph#: 1.1 10*3/uL (ref 0.9–3.3)

## 2010-11-30 LAB — COMPREHENSIVE METABOLIC PANEL
ALT: 30 U/L (ref 0–35)
CO2: 26 mEq/L (ref 19–32)
Calcium: 9.5 mg/dL (ref 8.4–10.5)
Chloride: 105 mEq/L (ref 96–112)
Creatinine, Ser: 0.97 mg/dL (ref 0.40–1.20)
Glucose, Bld: 140 mg/dL — ABNORMAL HIGH (ref 70–99)
Sodium: 139 mEq/L (ref 135–145)
Total Bilirubin: 0.5 mg/dL (ref 0.3–1.2)
Total Protein: 7.7 g/dL (ref 6.0–8.3)

## 2010-11-30 LAB — UA PROTEIN, DIPSTICK - CHCC: Protein, Urine: NEGATIVE mg/dL

## 2010-12-10 LAB — CBC WITH DIFFERENTIAL/PLATELET
Basophils Absolute: 0 10*3/uL (ref 0.0–0.1)
EOS%: 0.4 % (ref 0.0–7.0)
Eosinophils Absolute: 0 10*3/uL (ref 0.0–0.5)
HCT: 39.8 % (ref 34.8–46.6)
HGB: 13.2 g/dL (ref 11.6–15.9)
MCH: 31.2 pg (ref 25.1–34.0)
MCV: 94.1 fL (ref 79.5–101.0)
MONO%: 11.3 % (ref 0.0–14.0)
NEUT#: 7.4 10*3/uL — ABNORMAL HIGH (ref 1.5–6.5)
NEUT%: 74.7 % (ref 38.4–76.8)
RDW: 14.5 % (ref 11.2–14.5)

## 2010-12-10 LAB — UA PROTEIN, DIPSTICK - CHCC: Protein, Urine: 30 mg/dL

## 2010-12-24 ENCOUNTER — Ambulatory Visit: Payer: Self-pay | Admitting: Oncology

## 2010-12-28 LAB — CBC WITH DIFFERENTIAL/PLATELET
BASO%: 0.2 % (ref 0.0–2.0)
Basophils Absolute: 0 10*3/uL (ref 0.0–0.1)
EOS%: 1.9 % (ref 0.0–7.0)
Eosinophils Absolute: 0.1 10*3/uL (ref 0.0–0.5)
HCT: 38 % (ref 34.8–46.6)
HGB: 12.7 g/dL (ref 11.6–15.9)
LYMPH%: 29.7 % (ref 14.0–49.7)
MCH: 31.6 pg (ref 25.1–34.0)
MCHC: 33.4 g/dL (ref 31.5–36.0)
MCV: 94.5 fL (ref 79.5–101.0)
MONO#: 0.6 10*3/uL (ref 0.1–0.9)
MONO%: 14.6 % — ABNORMAL HIGH (ref 0.0–14.0)
NEUT#: 2.3 10*3/uL (ref 1.5–6.5)
NEUT%: 53.6 % (ref 38.4–76.8)
Platelets: 288 10*3/uL (ref 145–400)
RBC: 4.02 10*6/uL (ref 3.70–5.45)
RDW: 14.8 % — ABNORMAL HIGH (ref 11.2–14.5)
WBC: 4.3 10*3/uL (ref 3.9–10.3)
lymph#: 1.3 10*3/uL (ref 0.9–3.3)

## 2010-12-28 LAB — COMPREHENSIVE METABOLIC PANEL
ALT: 23 U/L (ref 0–35)
AST: 40 U/L — ABNORMAL HIGH (ref 0–37)
Albumin: 3.1 g/dL — ABNORMAL LOW (ref 3.5–5.2)
Alkaline Phosphatase: 247 U/L — ABNORMAL HIGH (ref 39–117)
BUN: 9 mg/dL (ref 6–23)
CO2: 25 mEq/L (ref 19–32)
Calcium: 9.4 mg/dL (ref 8.4–10.5)
Chloride: 105 mEq/L (ref 96–112)
Creatinine, Ser: 0.88 mg/dL (ref 0.40–1.20)
Glucose, Bld: 132 mg/dL — ABNORMAL HIGH (ref 70–99)
Potassium: 4.1 mEq/L (ref 3.5–5.3)
Sodium: 138 mEq/L (ref 135–145)
Total Bilirubin: 0.6 mg/dL (ref 0.3–1.2)
Total Protein: 8.4 g/dL — ABNORMAL HIGH (ref 6.0–8.3)

## 2010-12-28 LAB — UA PROTEIN, DIPSTICK - CHCC: Protein, Urine: NEGATIVE mg/dL

## 2011-01-01 ENCOUNTER — Other Ambulatory Visit: Payer: Self-pay | Admitting: Oncology

## 2011-01-01 DIAGNOSIS — Z85038 Personal history of other malignant neoplasm of large intestine: Secondary | ICD-10-CM

## 2011-01-03 ENCOUNTER — Encounter: Payer: Self-pay | Admitting: Oncology

## 2011-01-11 LAB — CBC WITH DIFFERENTIAL/PLATELET
BASO%: 0.4 % (ref 0.0–2.0)
LYMPH%: 32.1 % (ref 14.0–49.7)
MCHC: 33.2 g/dL (ref 31.5–36.0)
MONO#: 0.7 10*3/uL (ref 0.1–0.9)
MONO%: 12.7 % (ref 0.0–14.0)
Platelets: 193 10*3/uL (ref 145–400)
RBC: 4.07 10*6/uL (ref 3.70–5.45)
RDW: 15.3 % — ABNORMAL HIGH (ref 11.2–14.5)
WBC: 5.4 10*3/uL (ref 3.9–10.3)
nRBC: 0 % (ref 0–0)

## 2011-01-11 LAB — COMPREHENSIVE METABOLIC PANEL
Alkaline Phosphatase: 228 U/L — ABNORMAL HIGH (ref 39–117)
BUN: 10 mg/dL (ref 6–23)
Glucose, Bld: 144 mg/dL — ABNORMAL HIGH (ref 70–99)
Total Bilirubin: 0.7 mg/dL (ref 0.3–1.2)

## 2011-01-11 LAB — UA PROTEIN, DIPSTICK - CHCC: Protein, Urine: <30 mg/dL

## 2011-01-13 ENCOUNTER — Encounter (HOSPITAL_BASED_OUTPATIENT_CLINIC_OR_DEPARTMENT_OTHER): Payer: Self-pay | Admitting: Oncology

## 2011-01-13 DIAGNOSIS — C180 Malignant neoplasm of cecum: Secondary | ICD-10-CM

## 2011-01-13 DIAGNOSIS — C787 Secondary malignant neoplasm of liver and intrahepatic bile duct: Secondary | ICD-10-CM

## 2011-01-19 ENCOUNTER — Ambulatory Visit (HOSPITAL_COMMUNITY)
Admission: RE | Admit: 2011-01-19 | Discharge: 2011-01-19 | Disposition: A | Discharge status: Home / Self Care | Payer: Self-pay | Source: Ambulatory Visit | Attending: Oncology | Admitting: Oncology

## 2011-01-19 ENCOUNTER — Other Ambulatory Visit: Payer: Self-pay | Admitting: Oncology

## 2011-01-19 ENCOUNTER — Encounter (HOSPITAL_COMMUNITY): Payer: Self-pay

## 2011-01-19 DIAGNOSIS — Z79899 Other long term (current) drug therapy: Secondary | ICD-10-CM | POA: Insufficient documentation

## 2011-01-19 DIAGNOSIS — J984 Other disorders of lung: Secondary | ICD-10-CM | POA: Insufficient documentation

## 2011-01-19 DIAGNOSIS — M899 Disorder of bone, unspecified: Secondary | ICD-10-CM | POA: Insufficient documentation

## 2011-01-19 DIAGNOSIS — Z85038 Personal history of other malignant neoplasm of large intestine: Secondary | ICD-10-CM

## 2011-01-19 DIAGNOSIS — C189 Malignant neoplasm of colon, unspecified: Secondary | ICD-10-CM | POA: Insufficient documentation

## 2011-01-19 DIAGNOSIS — R599 Enlarged lymph nodes, unspecified: Secondary | ICD-10-CM | POA: Insufficient documentation

## 2011-01-19 DIAGNOSIS — M949 Disorder of cartilage, unspecified: Secondary | ICD-10-CM | POA: Insufficient documentation

## 2011-01-19 DIAGNOSIS — C787 Secondary malignant neoplasm of liver and intrahepatic bile duct: Secondary | ICD-10-CM | POA: Insufficient documentation

## 2011-01-19 HISTORY — DX: Malignant neoplasm of colon, unspecified: C18.9

## 2011-01-19 MED ORDER — IOHEXOL 300 MG/ML  SOLN
100.0000 mL | Freq: Once | INTRAMUSCULAR | Status: AC | PRN
  Administered 2011-01-19: 100 mL via INTRAVENOUS

## 2011-01-25 ENCOUNTER — Encounter (HOSPITAL_BASED_OUTPATIENT_CLINIC_OR_DEPARTMENT_OTHER): Payer: Self-pay | Admitting: Oncology

## 2011-01-25 ENCOUNTER — Other Ambulatory Visit: Payer: Self-pay | Admitting: Oncology

## 2011-01-25 DIAGNOSIS — Z5112 Encounter for antineoplastic immunotherapy: Secondary | ICD-10-CM

## 2011-01-25 DIAGNOSIS — C180 Malignant neoplasm of cecum: Secondary | ICD-10-CM

## 2011-01-25 DIAGNOSIS — C787 Secondary malignant neoplasm of liver and intrahepatic bile duct: Secondary | ICD-10-CM

## 2011-01-25 DIAGNOSIS — Z5111 Encounter for antineoplastic chemotherapy: Secondary | ICD-10-CM

## 2011-01-25 LAB — UA PROTEIN, DIPSTICK - CHCC: Protein, Urine: <30 mg/dL

## 2011-01-25 LAB — COMPREHENSIVE METABOLIC PANEL
ALT: 23 U/L (ref 0–35)
AST: 39 U/L — ABNORMAL HIGH (ref 0–37)
Albumin: 3.2 g/dL — ABNORMAL LOW (ref 3.5–5.2)
CO2: 26 mEq/L (ref 19–32)
Calcium: 9.5 mg/dL (ref 8.4–10.5)
Chloride: 105 mEq/L (ref 96–112)
Creatinine, Ser: 0.98 mg/dL (ref 0.40–1.20)
Potassium: 4 mEq/L (ref 3.5–5.3)

## 2011-01-25 LAB — CBC WITH DIFFERENTIAL/PLATELET
Eosinophils Absolute: 0.1 10*3/uL (ref 0.0–0.5)
HCT: 37 % (ref 34.8–46.6)
LYMPH%: 26.8 % (ref 14.0–49.7)
MONO#: 0.5 10*3/uL (ref 0.1–0.9)
NEUT#: 2.7 10*3/uL (ref 1.5–6.5)
NEUT%: 61.2 % (ref 38.4–76.8)
Platelets: 234 10*3/uL (ref 145–400)
RBC: 3.92 10*6/uL (ref 3.70–5.45)
WBC: 4.5 10*3/uL (ref 3.9–10.3)
lymph#: 1.2 10*3/uL (ref 0.9–3.3)
nRBC: 0 % (ref 0–0)

## 2011-01-27 ENCOUNTER — Encounter (HOSPITAL_BASED_OUTPATIENT_CLINIC_OR_DEPARTMENT_OTHER): Payer: Self-pay | Admitting: Oncology

## 2011-01-27 DIAGNOSIS — C787 Secondary malignant neoplasm of liver and intrahepatic bile duct: Secondary | ICD-10-CM

## 2011-01-27 DIAGNOSIS — C180 Malignant neoplasm of cecum: Secondary | ICD-10-CM

## 2011-01-27 DIAGNOSIS — Z452 Encounter for adjustment and management of vascular access device: Secondary | ICD-10-CM

## 2011-02-08 ENCOUNTER — Encounter (HOSPITAL_BASED_OUTPATIENT_CLINIC_OR_DEPARTMENT_OTHER): Payer: Self-pay | Admitting: Oncology

## 2011-02-08 ENCOUNTER — Other Ambulatory Visit: Payer: Self-pay | Admitting: Oncology

## 2011-02-08 DIAGNOSIS — C180 Malignant neoplasm of cecum: Secondary | ICD-10-CM

## 2011-02-08 DIAGNOSIS — C787 Secondary malignant neoplasm of liver and intrahepatic bile duct: Secondary | ICD-10-CM

## 2011-02-08 DIAGNOSIS — Z5111 Encounter for antineoplastic chemotherapy: Secondary | ICD-10-CM

## 2011-02-08 LAB — CBC WITH DIFFERENTIAL/PLATELET
Basophils Absolute: 0 10*3/uL (ref 0.0–0.1)
Eosinophils Absolute: 0.1 10*3/uL (ref 0.0–0.5)
HGB: 12.4 g/dL (ref 11.6–15.9)
MCV: 95.2 fL (ref 79.5–101.0)
MONO#: 0.5 10*3/uL (ref 0.1–0.9)
MONO%: 12 % (ref 0.0–14.0)
NEUT#: 2.4 10*3/uL (ref 1.5–6.5)
RBC: 3.93 10*6/uL (ref 3.70–5.45)
RDW: 15.3 % — ABNORMAL HIGH (ref 11.2–14.5)
WBC: 4.1 10*3/uL (ref 3.9–10.3)
lymph#: 1.2 10*3/uL (ref 0.9–3.3)

## 2011-02-08 LAB — UA PROTEIN, DIPSTICK - CHCC: Protein, Urine: <30 mg/dL

## 2011-02-10 ENCOUNTER — Encounter: Payer: Self-pay | Admitting: Oncology

## 2011-03-03 ENCOUNTER — Encounter (HOSPITAL_BASED_OUTPATIENT_CLINIC_OR_DEPARTMENT_OTHER): Payer: Self-pay | Admitting: Oncology

## 2011-03-03 ENCOUNTER — Other Ambulatory Visit: Payer: Self-pay | Admitting: Oncology

## 2011-03-03 DIAGNOSIS — C180 Malignant neoplasm of cecum: Secondary | ICD-10-CM

## 2011-03-03 DIAGNOSIS — Z5111 Encounter for antineoplastic chemotherapy: Secondary | ICD-10-CM

## 2011-03-03 DIAGNOSIS — Z5112 Encounter for antineoplastic immunotherapy: Secondary | ICD-10-CM

## 2011-03-03 DIAGNOSIS — C787 Secondary malignant neoplasm of liver and intrahepatic bile duct: Secondary | ICD-10-CM

## 2011-03-03 LAB — CBC WITH DIFFERENTIAL/PLATELET
BASO%: 0.3 % (ref 0.0–2.0)
Basophils Absolute: 0 10*3/uL (ref 0.0–0.1)
Eosinophils Absolute: 0.1 10*3/uL (ref 0.0–0.5)
HCT: 41 % (ref 34.8–46.6)
HGB: 13.6 g/dL (ref 11.6–15.9)
LYMPH%: 18.7 % (ref 14.0–49.7)
MCHC: 33.2 g/dL (ref 31.5–36.0)
MONO#: 0.8 10*3/uL (ref 0.1–0.9)
NEUT#: 5 10*3/uL (ref 1.5–6.5)
NEUT%: 69.6 % (ref 38.4–76.8)
Platelets: 223 10*3/uL (ref 145–400)
WBC: 7.2 10*3/uL (ref 3.9–10.3)
lymph#: 1.3 10*3/uL (ref 0.9–3.3)

## 2011-03-03 LAB — COMPREHENSIVE METABOLIC PANEL
Albumin: 3.2 g/dL — ABNORMAL LOW (ref 3.5–5.2)
Alkaline Phosphatase: 311 U/L — ABNORMAL HIGH (ref 39–117)
BUN: 10 mg/dL (ref 6–23)
Calcium: 9.3 mg/dL (ref 8.4–10.5)
Chloride: 101 mEq/L (ref 96–112)
Glucose, Bld: 145 mg/dL — ABNORMAL HIGH (ref 70–99)
Potassium: 4.2 mEq/L (ref 3.5–5.3)
Sodium: 135 mEq/L (ref 135–145)
Total Protein: 8.5 g/dL — ABNORMAL HIGH (ref 6.0–8.3)

## 2011-03-05 ENCOUNTER — Encounter (HOSPITAL_BASED_OUTPATIENT_CLINIC_OR_DEPARTMENT_OTHER): Payer: Self-pay | Admitting: Oncology

## 2011-03-05 DIAGNOSIS — C180 Malignant neoplasm of cecum: Secondary | ICD-10-CM

## 2011-03-05 DIAGNOSIS — C787 Secondary malignant neoplasm of liver and intrahepatic bile duct: Secondary | ICD-10-CM

## 2011-03-16 LAB — GLUCOSE, CAPILLARY: Glucose-Capillary: 95 mg/dL (ref 70–99)

## 2011-03-22 ENCOUNTER — Other Ambulatory Visit: Payer: Self-pay | Admitting: Oncology

## 2011-03-22 ENCOUNTER — Encounter (HOSPITAL_BASED_OUTPATIENT_CLINIC_OR_DEPARTMENT_OTHER): Payer: Self-pay | Admitting: Oncology

## 2011-03-22 DIAGNOSIS — Z5111 Encounter for antineoplastic chemotherapy: Secondary | ICD-10-CM

## 2011-03-22 DIAGNOSIS — C787 Secondary malignant neoplasm of liver and intrahepatic bile duct: Secondary | ICD-10-CM

## 2011-03-22 DIAGNOSIS — C180 Malignant neoplasm of cecum: Secondary | ICD-10-CM

## 2011-03-22 DIAGNOSIS — Z5112 Encounter for antineoplastic immunotherapy: Secondary | ICD-10-CM

## 2011-03-22 LAB — CBC WITH DIFFERENTIAL/PLATELET
EOS%: 3.3 % (ref 0.0–7.0)
Eosinophils Absolute: 0.1 10*3/uL (ref 0.0–0.5)
HCT: 37.8 % (ref 34.8–46.6)
LYMPH%: 31.1 % (ref 14.0–49.7)
MCHC: 33.1 g/dL (ref 31.5–36.0)
MONO#: 0.7 10*3/uL (ref 0.1–0.9)
NEUT#: 2.1 10*3/uL (ref 1.5–6.5)
Platelets: 218 10*3/uL (ref 145–400)
RBC: 4.01 10*6/uL (ref 3.70–5.45)
WBC: 4.3 10*3/uL (ref 3.9–10.3)
lymph#: 1.3 10*3/uL (ref 0.9–3.3)

## 2011-03-22 LAB — UA PROTEIN, DIPSTICK - CHCC: Protein, Urine: NEGATIVE mg/dL

## 2011-03-22 LAB — COMPREHENSIVE METABOLIC PANEL
CO2: 25 mEq/L (ref 19–32)
Creatinine, Ser: 0.84 mg/dL (ref 0.40–1.20)
Glucose, Bld: 124 mg/dL — ABNORMAL HIGH (ref 70–99)
Total Bilirubin: 0.8 mg/dL (ref 0.3–1.2)

## 2011-03-22 LAB — GLUCOSE, CAPILLARY: Glucose-Capillary: 99 mg/dL (ref 70–99)

## 2011-03-24 ENCOUNTER — Encounter (HOSPITAL_BASED_OUTPATIENT_CLINIC_OR_DEPARTMENT_OTHER): Payer: Self-pay | Admitting: Oncology

## 2011-03-24 DIAGNOSIS — C787 Secondary malignant neoplasm of liver and intrahepatic bile duct: Secondary | ICD-10-CM

## 2011-03-24 DIAGNOSIS — C180 Malignant neoplasm of cecum: Secondary | ICD-10-CM

## 2011-04-05 ENCOUNTER — Other Ambulatory Visit: Payer: Self-pay | Admitting: Oncology

## 2011-04-05 ENCOUNTER — Encounter (HOSPITAL_BASED_OUTPATIENT_CLINIC_OR_DEPARTMENT_OTHER): Payer: Self-pay | Admitting: Oncology

## 2011-04-05 DIAGNOSIS — C787 Secondary malignant neoplasm of liver and intrahepatic bile duct: Secondary | ICD-10-CM

## 2011-04-05 DIAGNOSIS — Z5111 Encounter for antineoplastic chemotherapy: Secondary | ICD-10-CM

## 2011-04-05 DIAGNOSIS — Z5112 Encounter for antineoplastic immunotherapy: Secondary | ICD-10-CM

## 2011-04-05 DIAGNOSIS — C180 Malignant neoplasm of cecum: Secondary | ICD-10-CM

## 2011-04-05 LAB — COMPREHENSIVE METABOLIC PANEL
ALT: 31 U/L (ref 0–35)
Albumin: 3.7 g/dL (ref 3.5–5.2)
Alkaline Phosphatase: 292 U/L — ABNORMAL HIGH (ref 39–117)
CO2: 23 mEq/L (ref 19–32)
Glucose, Bld: 123 mg/dL — ABNORMAL HIGH (ref 70–99)
Potassium: 3.9 mEq/L (ref 3.5–5.3)
Sodium: 138 mEq/L (ref 135–145)
Total Protein: 7.2 g/dL (ref 6.0–8.3)

## 2011-04-05 LAB — UA PROTEIN, DIPSTICK - CHCC: Protein, Urine: NEGATIVE mg/dL

## 2011-04-05 LAB — CBC WITH DIFFERENTIAL/PLATELET
Basophils Absolute: 0 10*3/uL (ref 0.0–0.1)
Eosinophils Absolute: 0.1 10*3/uL (ref 0.0–0.5)
HGB: 13.4 g/dL (ref 11.6–15.9)
NEUT#: 3 10*3/uL (ref 1.5–6.5)
RDW: 14.9 % — ABNORMAL HIGH (ref 11.2–14.5)
WBC: 4.7 10*3/uL (ref 3.9–10.3)
lymph#: 1.2 10*3/uL (ref 0.9–3.3)

## 2011-04-07 ENCOUNTER — Encounter (HOSPITAL_BASED_OUTPATIENT_CLINIC_OR_DEPARTMENT_OTHER): Payer: Self-pay | Admitting: Oncology

## 2011-04-07 DIAGNOSIS — C787 Secondary malignant neoplasm of liver and intrahepatic bile duct: Secondary | ICD-10-CM

## 2011-04-07 DIAGNOSIS — C180 Malignant neoplasm of cecum: Secondary | ICD-10-CM

## 2011-04-07 DIAGNOSIS — Z452 Encounter for adjustment and management of vascular access device: Secondary | ICD-10-CM

## 2011-04-19 ENCOUNTER — Other Ambulatory Visit: Payer: Self-pay | Admitting: Oncology

## 2011-04-19 ENCOUNTER — Encounter (HOSPITAL_BASED_OUTPATIENT_CLINIC_OR_DEPARTMENT_OTHER): Payer: Self-pay | Admitting: Oncology

## 2011-04-19 DIAGNOSIS — C180 Malignant neoplasm of cecum: Secondary | ICD-10-CM

## 2011-04-19 DIAGNOSIS — C787 Secondary malignant neoplasm of liver and intrahepatic bile duct: Secondary | ICD-10-CM

## 2011-04-19 DIAGNOSIS — Z5111 Encounter for antineoplastic chemotherapy: Secondary | ICD-10-CM

## 2011-04-19 DIAGNOSIS — Z5112 Encounter for antineoplastic immunotherapy: Secondary | ICD-10-CM

## 2011-04-19 LAB — COMPREHENSIVE METABOLIC PANEL
ALT: 26 U/L (ref 0–35)
AST: 34 U/L (ref 0–37)
Alkaline Phosphatase: 265 U/L — ABNORMAL HIGH (ref 39–117)
BUN: 9 mg/dL (ref 6–23)
Chloride: 104 mEq/L (ref 96–112)
Creatinine, Ser: 0.8 mg/dL (ref 0.40–1.20)
Total Bilirubin: 0.7 mg/dL (ref 0.3–1.2)

## 2011-04-19 LAB — CBC WITH DIFFERENTIAL/PLATELET
BASO%: 0.2 % (ref 0.0–2.0)
Eosinophils Absolute: 0.1 10*3/uL (ref 0.0–0.5)
MCHC: 33 g/dL (ref 31.5–36.0)
MCV: 93.2 fL (ref 79.5–101.0)
MONO%: 14.7 % — ABNORMAL HIGH (ref 0.0–14.0)
NEUT#: 2.8 10*3/uL (ref 1.5–6.5)
RBC: 3.84 10*6/uL (ref 3.70–5.45)
RDW: 15 % — ABNORMAL HIGH (ref 11.2–14.5)
WBC: 4.8 10*3/uL (ref 3.9–10.3)
nRBC: 0 % (ref 0–0)

## 2011-04-19 LAB — UA PROTEIN, DIPSTICK - CHCC: Protein, Urine: NEGATIVE mg/dL

## 2011-04-21 ENCOUNTER — Encounter (HOSPITAL_BASED_OUTPATIENT_CLINIC_OR_DEPARTMENT_OTHER): Payer: Self-pay | Admitting: Oncology

## 2011-04-21 DIAGNOSIS — C787 Secondary malignant neoplasm of liver and intrahepatic bile duct: Secondary | ICD-10-CM

## 2011-04-21 DIAGNOSIS — C180 Malignant neoplasm of cecum: Secondary | ICD-10-CM

## 2011-04-21 DIAGNOSIS — Z452 Encounter for adjustment and management of vascular access device: Secondary | ICD-10-CM

## 2011-04-27 NOTE — Assessment & Plan Note (Signed)
[facility]                            CARDIOLOGY OFFICE NOTE   Sylvia Walsh, Sylvia Walsh                     MRN:          [id number]  DATE:10/05/2007                            DOB:          11/11/1946    REFERRING PHYSICIAN:  Leighton Roach. Truett Perna, M.D.   CHIEF COMPLAINT:  Increased heart rate.   HISTORY OF PRESENT ILLNESS:  Sylvia Walsh is a very delightful lady with a  difficult problem.  She is a 65 year old, who noticed fatigue and was  found to be anemic.  She apparently was transfused.  She subsequently  underwent endoscopy and a CT scan, which demonstrated extensive colon  cancer.  She has been treated with chemotherapy for quite some time now.  Her weight has been dropping.  Her appetite has not been very good.  She  has been followed by Dr. Truett Perna and has been subsequently referred to  [facility] for major extensive colon  cancer resection, to include apparently liver metastases and other  items.  She apparently has been on chemotherapy since February.  Dr.  Truett Perna tells me that she has been on no cardiotoxic drugs.  Her heart  rate has been in the 90s to 100 range, but today when she walked in,  noticed the pulse was about 130.  She denies any chest pain and actually  did not know anything about this.  She said that, when they took her  pulse, they just noticed it was quite rapid, and Dr. Truett Perna became  concerned and referred her over.  She does admit that she has not been  eating well or taking in fluids very well.  She denies any chest pain or  significant shortness of breath.  She has had no swelling in the legs or  other major symptoms.   PAST MEDICAL HISTORY:  The patient had a tubal ligation in 1975.  In  1986 she had a cholecystectomy and in 1993, apparently had a  hysterectomy.  She had been getting along well, otherwise.   CURRENT MEDICATIONS INCLUDE:  1. Potassium 20 mEq daily.  2. Magnesium  one daily.  3. An iron tablet three tablets daily.   FAMILY HISTORY:  Her mother is living at 31 and has a stent.  Her father  is living at 33.  She has seven brothers and one sister, one brother of  whom has colon cancer.   SOCIAL HISTORY:  The patient is a nonsmoker.  She has two children.  She  made switches for mobile homes.   REVIEW OF SYSTEMS:  She has not had any hair-loss.  Her hearing and  vision have been intact.  Recently she has not had blood in her stool  that she knows of.  Her appetite has been poor and her weight has  dropped rather substantially.  She claims to have lost 40 pounds, but  apparently she has gone from 150 down to 133.  Her appetite has been  poor.  She has noticed no skin changes or hair-loss.  She has not had  heat or cold intolerance.  PHYSICAL EXAMINATION:  She is a thin-appearing female, in no acute  distress.  The weight is 133 pounds, the blood pressure is 96/80 and the pulse is  133.  The cardiac rhythm is regular.  The lung fields are clear to auscultation and percussion.  The jugular veins are absolutely flat.  The mucous membranes are somewhat dry.  The abdomen is soft and there is no obvious hepatosplenomegaly noted.  The extremities reveal absolutely no edema at the present time.   The patient's electrocardiogram is remarkable for literally what appears  to be sinus tachycardia.  It does not have the appearance of atrial  flutter.  The PR interval is normal, the QRS duration is normal, the QT  is not substantially prolonged.   The patient has had some recent laboratory studies.  Her sodium is 140,  BUN 7, creatinine 0.7, alkaline phosphatase 148, SGOT 32, SGPT 22.  Her  white count is 6300, hemoglobin 13.8, hematocrit 39.8, her platelet  count is 390,000.   The patient has had recent abdominal CT with contrast.  The liver showed  some heterogeneous attenuation with previously-seen liver lesions over  the dome of the liver and in the  right hepatic lobe.  There is diffuse  thickening and dilatation of the terminal ileum with an associated cecal  mass.  There is more prominent enhancement peripherally, suggesting  central necrosis of the large lesion.  All of these findings suggest  right lower quadrant mass, consistent with cancer, with metastases to  the liver with some improvement.   IMPRESSION:  1. Sinus tachycardia of uncertain etiology.  2. Extensive colon cancer, status post chemotherapy, preparing for      surgery.  3. Other surgeries as listed.   DISPOSITION:  Dr. Truett Perna and I spoke.  Fortunately, at present, there  is not significant elevation of the neck veins.  This would seem to make  cardiac tamponade less likely.  Her oral intake has been relatively poor  and it is possible that she could be somewhat volume-depleted.  We  talked about various options.  Other considerations might be pulmonary  embolus, but she has absolutely no shortness of breath, is asymptomatic,  and there is no evidence of lower extremity DVT.  She does not have  fever and she is certainly not anxious.  After considerable discussion,  we elected to have her go back over to the cancer center, where Dr.  Truett Perna will see her.  They will give her about a liter of saline to  try to gently hydrate her to see if, in fact, this might help with the  tachycardia.  More than likely, they will set her up for a CT angio of  the chest to rule out pulmonary embolus as a possible underlying  etiology.  Moreover, other considerations would be to do thyroid testing  and a 2D echocardiogram to make sure there is not some sort of  underlying cardiomyopathic process or other etiology of her findings.  I  explained this to the patient.  She understands and is agreeable to  returning to the cancer center, where Dr. Truett Perna will supervise her  evaluation.     Arturo Morton. Riley Kill, MD, [facility]  Electronically Signed    TDS/MedQ  DD: 10/05/2007  DT:  10/06/2007  Job #: [id number]

## 2011-05-03 ENCOUNTER — Other Ambulatory Visit: Payer: Self-pay | Admitting: Oncology

## 2011-05-03 ENCOUNTER — Ambulatory Visit (HOSPITAL_COMMUNITY)
Admission: RE | Admit: 2011-05-03 | Discharge: 2011-05-03 | Disposition: A | Discharge status: Home / Self Care | Payer: Self-pay | Source: Ambulatory Visit | Attending: Oncology | Admitting: Oncology

## 2011-05-03 ENCOUNTER — Encounter (HOSPITAL_BASED_OUTPATIENT_CLINIC_OR_DEPARTMENT_OTHER): Payer: Self-pay | Admitting: Oncology

## 2011-05-03 DIAGNOSIS — R52 Pain, unspecified: Secondary | ICD-10-CM

## 2011-05-03 DIAGNOSIS — M25559 Pain in unspecified hip: Secondary | ICD-10-CM | POA: Insufficient documentation

## 2011-05-03 DIAGNOSIS — C787 Secondary malignant neoplasm of liver and intrahepatic bile duct: Secondary | ICD-10-CM

## 2011-05-03 DIAGNOSIS — C189 Malignant neoplasm of colon, unspecified: Secondary | ICD-10-CM

## 2011-05-03 DIAGNOSIS — M79609 Pain in unspecified limb: Secondary | ICD-10-CM | POA: Insufficient documentation

## 2011-05-03 DIAGNOSIS — Z5111 Encounter for antineoplastic chemotherapy: Secondary | ICD-10-CM

## 2011-05-03 DIAGNOSIS — C180 Malignant neoplasm of cecum: Secondary | ICD-10-CM

## 2011-05-03 DIAGNOSIS — Z85038 Personal history of other malignant neoplasm of large intestine: Secondary | ICD-10-CM | POA: Insufficient documentation

## 2011-05-03 LAB — CBC WITH DIFFERENTIAL/PLATELET
BASO%: 0.4 % (ref 0.0–2.0)
Basophils Absolute: 0 10*3/uL (ref 0.0–0.1)
EOS%: 0.8 % (ref 0.0–7.0)
HCT: 41.3 % (ref 34.8–46.6)
HGB: 13.6 g/dL (ref 11.6–15.9)
LYMPH%: 18.9 % (ref 14.0–49.7)
MCH: 31.1 pg (ref 25.1–34.0)
MCHC: 32.9 g/dL (ref 31.5–36.0)
MCV: 94.5 fL (ref 79.5–101.0)
NEUT%: 69.6 % (ref 38.4–76.8)
Platelets: 205 10*3/uL (ref 145–400)

## 2011-05-04 ENCOUNTER — Ambulatory Visit (HOSPITAL_COMMUNITY)
Admission: RE | Admit: 2011-05-04 | Discharge: 2011-05-04 | Disposition: A | Discharge status: Home / Self Care | Payer: Self-pay | Source: Ambulatory Visit | Attending: Oncology | Admitting: Oncology

## 2011-05-04 ENCOUNTER — Encounter (HOSPITAL_COMMUNITY): Payer: Self-pay

## 2011-05-04 DIAGNOSIS — R599 Enlarged lymph nodes, unspecified: Secondary | ICD-10-CM | POA: Insufficient documentation

## 2011-05-04 DIAGNOSIS — M47817 Spondylosis without myelopathy or radiculopathy, lumbosacral region: Secondary | ICD-10-CM | POA: Insufficient documentation

## 2011-05-04 DIAGNOSIS — C189 Malignant neoplasm of colon, unspecified: Secondary | ICD-10-CM | POA: Insufficient documentation

## 2011-05-04 DIAGNOSIS — J984 Other disorders of lung: Secondary | ICD-10-CM | POA: Insufficient documentation

## 2011-05-04 DIAGNOSIS — Z9079 Acquired absence of other genital organ(s): Secondary | ICD-10-CM | POA: Insufficient documentation

## 2011-05-04 DIAGNOSIS — Z9089 Acquired absence of other organs: Secondary | ICD-10-CM | POA: Insufficient documentation

## 2011-05-04 DIAGNOSIS — Z90710 Acquired absence of both cervix and uterus: Secondary | ICD-10-CM | POA: Insufficient documentation

## 2011-05-04 DIAGNOSIS — C787 Secondary malignant neoplasm of liver and intrahepatic bile duct: Secondary | ICD-10-CM | POA: Insufficient documentation

## 2011-05-04 MED ORDER — IOHEXOL 300 MG/ML  SOLN
100.0000 mL | Freq: Once | INTRAMUSCULAR | Status: AC | PRN
  Administered 2011-05-04: 100 mL via INTRAVENOUS

## 2011-05-05 ENCOUNTER — Encounter (HOSPITAL_BASED_OUTPATIENT_CLINIC_OR_DEPARTMENT_OTHER): Payer: Self-pay | Admitting: Oncology

## 2011-05-05 DIAGNOSIS — C180 Malignant neoplasm of cecum: Secondary | ICD-10-CM

## 2011-05-05 DIAGNOSIS — R918 Other nonspecific abnormal finding of lung field: Secondary | ICD-10-CM

## 2011-05-05 DIAGNOSIS — C787 Secondary malignant neoplasm of liver and intrahepatic bile duct: Secondary | ICD-10-CM

## 2011-05-05 DIAGNOSIS — R599 Enlarged lymph nodes, unspecified: Secondary | ICD-10-CM

## 2011-05-17 ENCOUNTER — Encounter (HOSPITAL_BASED_OUTPATIENT_CLINIC_OR_DEPARTMENT_OTHER): Payer: Self-pay | Admitting: Oncology

## 2011-05-17 DIAGNOSIS — IMO0001 Reserved for inherently not codable concepts without codable children: Secondary | ICD-10-CM

## 2011-05-17 DIAGNOSIS — C787 Secondary malignant neoplasm of liver and intrahepatic bile duct: Secondary | ICD-10-CM

## 2011-05-17 DIAGNOSIS — R000 Tachycardia, unspecified: Secondary | ICD-10-CM

## 2011-05-17 DIAGNOSIS — C180 Malignant neoplasm of cecum: Secondary | ICD-10-CM

## 2011-05-20 ENCOUNTER — Other Ambulatory Visit (HOSPITAL_COMMUNITY): Payer: Self-pay | Admitting: Physician Assistant

## 2011-05-20 DIAGNOSIS — M545 Low back pain: Secondary | ICD-10-CM

## 2011-05-24 ENCOUNTER — Other Ambulatory Visit: Payer: Self-pay | Admitting: Specialist

## 2011-05-24 DIAGNOSIS — M5126 Other intervertebral disc displacement, lumbar region: Secondary | ICD-10-CM

## 2011-05-24 DIAGNOSIS — M545 Low back pain: Secondary | ICD-10-CM

## 2011-05-31 ENCOUNTER — Other Ambulatory Visit: Payer: Self-pay | Admitting: Oncology

## 2011-05-31 ENCOUNTER — Encounter (HOSPITAL_BASED_OUTPATIENT_CLINIC_OR_DEPARTMENT_OTHER): Payer: Self-pay | Admitting: Oncology

## 2011-05-31 DIAGNOSIS — C180 Malignant neoplasm of cecum: Secondary | ICD-10-CM

## 2011-05-31 DIAGNOSIS — Z5111 Encounter for antineoplastic chemotherapy: Secondary | ICD-10-CM

## 2011-05-31 DIAGNOSIS — C787 Secondary malignant neoplasm of liver and intrahepatic bile duct: Secondary | ICD-10-CM

## 2011-05-31 DIAGNOSIS — R918 Other nonspecific abnormal finding of lung field: Secondary | ICD-10-CM

## 2011-05-31 DIAGNOSIS — C189 Malignant neoplasm of colon, unspecified: Secondary | ICD-10-CM

## 2011-05-31 DIAGNOSIS — R599 Enlarged lymph nodes, unspecified: Secondary | ICD-10-CM

## 2011-05-31 LAB — CBC WITH DIFFERENTIAL/PLATELET
BASO%: 0.1 % (ref 0.0–2.0)
Basophils Absolute: 0 10*3/uL (ref 0.0–0.1)
EOS%: 0.2 % (ref 0.0–7.0)
HCT: 41.1 % (ref 34.8–46.6)
HGB: 13.5 g/dL (ref 11.6–15.9)
LYMPH%: 6.1 % — ABNORMAL LOW (ref 14.0–49.7)
MCH: 30.2 pg (ref 25.1–34.0)
MCHC: 32.8 g/dL (ref 31.5–36.0)
MONO#: 1.1 10*3/uL — ABNORMAL HIGH (ref 0.1–0.9)
NEUT%: 87.7 % — ABNORMAL HIGH (ref 38.4–76.8)
Platelets: 411 10*3/uL — ABNORMAL HIGH (ref 145–400)
lymph#: 1.2 10*3/uL (ref 0.9–3.3)

## 2011-05-31 LAB — COMPREHENSIVE METABOLIC PANEL
AST: 44 U/L — ABNORMAL HIGH (ref 0–37)
Albumin: 2.7 g/dL — ABNORMAL LOW (ref 3.5–5.2)
BUN: 21 mg/dL (ref 6–23)
Calcium: 10 mg/dL (ref 8.4–10.5)
Chloride: 97 mEq/L (ref 96–112)
Creatinine, Ser: 0.69 mg/dL (ref 0.50–1.10)
Glucose, Bld: 168 mg/dL — ABNORMAL HIGH (ref 70–99)
Potassium: 4.4 mEq/L (ref 3.5–5.3)

## 2011-06-02 ENCOUNTER — Encounter: Payer: Self-pay | Admitting: Oncology

## 2011-06-03 ENCOUNTER — Ambulatory Visit
Admission: RE | Admit: 2011-06-03 | Discharge: 2011-06-03 | Disposition: A | Discharge status: Home / Self Care | Payer: No Typology Code available for payment source | Source: Ambulatory Visit | Attending: Specialist | Admitting: Specialist

## 2011-06-03 DIAGNOSIS — M545 Low back pain: Secondary | ICD-10-CM

## 2011-06-03 DIAGNOSIS — M5126 Other intervertebral disc displacement, lumbar region: Secondary | ICD-10-CM

## 2011-06-03 MED ORDER — GADOBENATE DIMEGLUMINE 529 MG/ML IV SOLN
14.0000 mL | Freq: Once | INTRAVENOUS | Status: AC | PRN
  Administered 2011-06-03: 14 mL via INTRAVENOUS

## 2011-06-09 ENCOUNTER — Ambulatory Visit
Admission: RE | Admit: 2011-06-09 | Discharge: 2011-06-09 | Disposition: A | Discharge status: Home / Self Care | Payer: PRIVATE HEALTH INSURANCE | Source: Ambulatory Visit | Attending: Radiation Oncology | Admitting: Radiation Oncology

## 2011-06-09 DIAGNOSIS — Z510 Encounter for antineoplastic radiation therapy: Secondary | ICD-10-CM | POA: Insufficient documentation

## 2011-06-09 DIAGNOSIS — C7951 Secondary malignant neoplasm of bone: Secondary | ICD-10-CM | POA: Insufficient documentation

## 2011-06-09 DIAGNOSIS — C189 Malignant neoplasm of colon, unspecified: Secondary | ICD-10-CM | POA: Insufficient documentation

## 2011-06-09 DIAGNOSIS — Z808 Family history of malignant neoplasm of other organs or systems: Secondary | ICD-10-CM | POA: Insufficient documentation

## 2011-06-09 DIAGNOSIS — Z9089 Acquired absence of other organs: Secondary | ICD-10-CM | POA: Insufficient documentation

## 2011-06-09 DIAGNOSIS — Z79899 Other long term (current) drug therapy: Secondary | ICD-10-CM | POA: Insufficient documentation

## 2011-06-09 DIAGNOSIS — Z800 Family history of malignant neoplasm of digestive organs: Secondary | ICD-10-CM | POA: Insufficient documentation

## 2011-06-09 DIAGNOSIS — R5381 Other malaise: Secondary | ICD-10-CM | POA: Insufficient documentation

## 2011-06-14 ENCOUNTER — Encounter (HOSPITAL_BASED_OUTPATIENT_CLINIC_OR_DEPARTMENT_OTHER): Payer: Self-pay | Admitting: Oncology

## 2011-06-14 ENCOUNTER — Other Ambulatory Visit: Payer: Self-pay | Admitting: Oncology

## 2011-06-14 DIAGNOSIS — Z5112 Encounter for antineoplastic immunotherapy: Secondary | ICD-10-CM

## 2011-06-14 DIAGNOSIS — Z5111 Encounter for antineoplastic chemotherapy: Secondary | ICD-10-CM

## 2011-06-14 DIAGNOSIS — C7952 Secondary malignant neoplasm of bone marrow: Secondary | ICD-10-CM

## 2011-06-14 DIAGNOSIS — C189 Malignant neoplasm of colon, unspecified: Secondary | ICD-10-CM

## 2011-06-14 DIAGNOSIS — C787 Secondary malignant neoplasm of liver and intrahepatic bile duct: Secondary | ICD-10-CM

## 2011-06-14 DIAGNOSIS — C180 Malignant neoplasm of cecum: Secondary | ICD-10-CM

## 2011-06-14 LAB — COMPREHENSIVE METABOLIC PANEL
ALT: 51 U/L — ABNORMAL HIGH (ref 0–35)
Alkaline Phosphatase: 402 U/L — ABNORMAL HIGH (ref 39–117)
Creatinine, Ser: 0.77 mg/dL (ref 0.50–1.10)
Sodium: 134 mEq/L — ABNORMAL LOW (ref 135–145)
Total Bilirubin: 0.4 mg/dL (ref 0.3–1.2)
Total Protein: 8.2 g/dL (ref 6.0–8.3)

## 2011-06-14 LAB — CBC WITH DIFFERENTIAL/PLATELET
BASO%: 0 % (ref 0.0–2.0)
EOS%: 0 % (ref 0.0–7.0)
MCH: 30.9 pg (ref 25.1–34.0)
MCHC: 33.7 g/dL (ref 31.5–36.0)
MCV: 91.8 fL (ref 79.5–101.0)
MONO%: 13.9 % (ref 0.0–14.0)
NEUT#: 2.9 10*3/uL (ref 1.5–6.5)
RBC: 4.17 10*6/uL (ref 3.70–5.45)
RDW: 14.7 % — ABNORMAL HIGH (ref 11.2–14.5)

## 2011-06-14 LAB — MAGNESIUM: Magnesium: 2.6 mg/dL — ABNORMAL HIGH (ref 1.5–2.5)

## 2011-06-14 LAB — UA PROTEIN, DIPSTICK - CHCC: Protein, Urine: NEGATIVE mg/dL

## 2011-06-16 ENCOUNTER — Encounter (HOSPITAL_BASED_OUTPATIENT_CLINIC_OR_DEPARTMENT_OTHER): Payer: No Typology Code available for payment source | Admitting: Oncology

## 2011-06-16 DIAGNOSIS — C180 Malignant neoplasm of cecum: Secondary | ICD-10-CM

## 2011-06-16 DIAGNOSIS — C787 Secondary malignant neoplasm of liver and intrahepatic bile duct: Secondary | ICD-10-CM

## 2011-06-28 ENCOUNTER — Other Ambulatory Visit: Payer: Self-pay | Admitting: Oncology

## 2011-06-28 ENCOUNTER — Encounter (HOSPITAL_BASED_OUTPATIENT_CLINIC_OR_DEPARTMENT_OTHER): Payer: Self-pay | Admitting: Oncology

## 2011-06-28 DIAGNOSIS — Z5111 Encounter for antineoplastic chemotherapy: Secondary | ICD-10-CM

## 2011-06-28 DIAGNOSIS — C787 Secondary malignant neoplasm of liver and intrahepatic bile duct: Secondary | ICD-10-CM

## 2011-06-28 DIAGNOSIS — Z5112 Encounter for antineoplastic immunotherapy: Secondary | ICD-10-CM

## 2011-06-28 DIAGNOSIS — C189 Malignant neoplasm of colon, unspecified: Secondary | ICD-10-CM

## 2011-06-28 DIAGNOSIS — C180 Malignant neoplasm of cecum: Secondary | ICD-10-CM

## 2011-06-28 LAB — COMPREHENSIVE METABOLIC PANEL
ALT: 81 U/L — ABNORMAL HIGH (ref 0–35)
BUN: 20 mg/dL (ref 6–23)
CO2: 28 mEq/L (ref 19–32)
Calcium: 9.4 mg/dL (ref 8.4–10.5)
Chloride: 101 mEq/L (ref 96–112)
Creatinine, Ser: 0.65 mg/dL (ref 0.50–1.10)
Glucose, Bld: 180 mg/dL — ABNORMAL HIGH (ref 70–99)

## 2011-06-28 LAB — CBC WITH DIFFERENTIAL/PLATELET
Basophils Absolute: 0 10*3/uL (ref 0.0–0.1)
Eosinophils Absolute: 0 10*3/uL (ref 0.0–0.5)
HGB: 13.3 g/dL (ref 11.6–15.9)
LYMPH%: 6.9 % — ABNORMAL LOW (ref 14.0–49.7)
MCH: 31.1 pg (ref 25.1–34.0)
MCV: 92.5 fL (ref 79.5–101.0)
MONO%: 12.6 % (ref 0.0–14.0)
NEUT#: 3.9 10*3/uL (ref 1.5–6.5)
NEUT%: 80.3 % — ABNORMAL HIGH (ref 38.4–76.8)
Platelets: 159 10*3/uL (ref 145–400)

## 2011-06-30 ENCOUNTER — Encounter (HOSPITAL_BASED_OUTPATIENT_CLINIC_OR_DEPARTMENT_OTHER): Payer: No Typology Code available for payment source | Admitting: Oncology

## 2011-06-30 DIAGNOSIS — Z452 Encounter for adjustment and management of vascular access device: Secondary | ICD-10-CM

## 2011-06-30 DIAGNOSIS — C787 Secondary malignant neoplasm of liver and intrahepatic bile duct: Secondary | ICD-10-CM

## 2011-06-30 DIAGNOSIS — C180 Malignant neoplasm of cecum: Secondary | ICD-10-CM

## 2011-07-12 ENCOUNTER — Encounter (HOSPITAL_BASED_OUTPATIENT_CLINIC_OR_DEPARTMENT_OTHER): Payer: Self-pay | Admitting: Oncology

## 2011-07-12 ENCOUNTER — Other Ambulatory Visit: Payer: Self-pay | Admitting: Oncology

## 2011-07-12 DIAGNOSIS — J069 Acute upper respiratory infection, unspecified: Secondary | ICD-10-CM

## 2011-07-12 DIAGNOSIS — R5381 Other malaise: Secondary | ICD-10-CM

## 2011-07-12 DIAGNOSIS — R599 Enlarged lymph nodes, unspecified: Secondary | ICD-10-CM

## 2011-07-12 DIAGNOSIS — C180 Malignant neoplasm of cecum: Secondary | ICD-10-CM

## 2011-07-12 DIAGNOSIS — C189 Malignant neoplasm of colon, unspecified: Secondary | ICD-10-CM

## 2011-07-12 DIAGNOSIS — C787 Secondary malignant neoplasm of liver and intrahepatic bile duct: Secondary | ICD-10-CM

## 2011-07-12 DIAGNOSIS — R918 Other nonspecific abnormal finding of lung field: Secondary | ICD-10-CM

## 2011-07-12 LAB — COMPREHENSIVE METABOLIC PANEL
ALT: 64 U/L — ABNORMAL HIGH (ref 0–35)
AST: 45 U/L — ABNORMAL HIGH (ref 0–37)
Creatinine, Ser: 0.83 mg/dL (ref 0.50–1.10)
Total Bilirubin: 1 mg/dL (ref 0.3–1.2)

## 2011-07-12 LAB — CBC WITH DIFFERENTIAL/PLATELET
Basophils Absolute: 0 10*3/uL (ref 0.0–0.1)
EOS%: 0.9 % (ref 0.0–7.0)
HGB: 14.8 g/dL (ref 11.6–15.9)
MCH: 32.5 pg (ref 25.1–34.0)
NEUT#: 1.3 10*3/uL — ABNORMAL LOW (ref 1.5–6.5)
RDW: 19.7 % — ABNORMAL HIGH (ref 11.2–14.5)
WBC: 3.5 10*3/uL — ABNORMAL LOW (ref 3.9–10.3)
lymph#: 1.6 10*3/uL (ref 0.9–3.3)

## 2011-07-12 LAB — UA PROTEIN, DIPSTICK - CHCC: Protein, Urine: <30 mg/dL

## 2011-07-12 LAB — MAGNESIUM: Magnesium: 2.2 mg/dL (ref 1.5–2.5)

## 2011-07-19 ENCOUNTER — Encounter (HOSPITAL_BASED_OUTPATIENT_CLINIC_OR_DEPARTMENT_OTHER): Payer: Self-pay | Admitting: Oncology

## 2011-07-19 ENCOUNTER — Other Ambulatory Visit: Payer: Self-pay | Admitting: Oncology

## 2011-07-19 DIAGNOSIS — R599 Enlarged lymph nodes, unspecified: Secondary | ICD-10-CM

## 2011-07-19 DIAGNOSIS — R918 Other nonspecific abnormal finding of lung field: Secondary | ICD-10-CM

## 2011-07-19 DIAGNOSIS — C180 Malignant neoplasm of cecum: Secondary | ICD-10-CM

## 2011-07-19 DIAGNOSIS — Z5111 Encounter for antineoplastic chemotherapy: Secondary | ICD-10-CM

## 2011-07-19 DIAGNOSIS — C787 Secondary malignant neoplasm of liver and intrahepatic bile duct: Secondary | ICD-10-CM

## 2011-07-19 LAB — CBC WITH DIFFERENTIAL/PLATELET
Basophils Absolute: 0 10*3/uL (ref 0.0–0.1)
EOS%: 0.7 % (ref 0.0–7.0)
Eosinophils Absolute: 0 10*3/uL (ref 0.0–0.5)
HCT: 39.7 % (ref 34.8–46.6)
HGB: 12.9 g/dL (ref 11.6–15.9)
MCH: 31.3 pg (ref 25.1–34.0)
MCV: 96.4 fL (ref 79.5–101.0)
NEUT#: 3.8 10*3/uL (ref 1.5–6.5)
NEUT%: 65.7 % (ref 38.4–76.8)
lymph#: 1.2 10*3/uL (ref 0.9–3.3)

## 2011-07-19 LAB — COMPREHENSIVE METABOLIC PANEL
Alkaline Phosphatase: 240 U/L — ABNORMAL HIGH (ref 39–117)
BUN: 10 mg/dL (ref 6–23)
CO2: 23 mEq/L (ref 19–32)
Glucose, Bld: 122 mg/dL — ABNORMAL HIGH (ref 70–99)
Sodium: 135 mEq/L (ref 135–145)
Total Bilirubin: 0.7 mg/dL (ref 0.3–1.2)
Total Protein: 6.3 g/dL (ref 6.0–8.3)

## 2011-07-19 LAB — TECHNOLOGIST REVIEW

## 2011-07-21 ENCOUNTER — Encounter: Payer: Self-pay | Admitting: Oncology

## 2011-08-02 ENCOUNTER — Other Ambulatory Visit: Payer: Self-pay | Admitting: Oncology

## 2011-08-02 ENCOUNTER — Encounter (HOSPITAL_BASED_OUTPATIENT_CLINIC_OR_DEPARTMENT_OTHER): Payer: Self-pay | Admitting: Oncology

## 2011-08-02 DIAGNOSIS — C787 Secondary malignant neoplasm of liver and intrahepatic bile duct: Secondary | ICD-10-CM

## 2011-08-02 DIAGNOSIS — C189 Malignant neoplasm of colon, unspecified: Secondary | ICD-10-CM

## 2011-08-02 DIAGNOSIS — R599 Enlarged lymph nodes, unspecified: Secondary | ICD-10-CM

## 2011-08-02 DIAGNOSIS — R918 Other nonspecific abnormal finding of lung field: Secondary | ICD-10-CM

## 2011-08-02 DIAGNOSIS — C180 Malignant neoplasm of cecum: Secondary | ICD-10-CM

## 2011-08-02 LAB — COMPREHENSIVE METABOLIC PANEL
ALT: 28 U/L (ref 0–35)
Albumin: 2.8 g/dL — ABNORMAL LOW (ref 3.5–5.2)
CO2: 26 mEq/L (ref 19–32)
Calcium: 9.5 mg/dL (ref 8.4–10.5)
Chloride: 96 mEq/L (ref 96–112)
Glucose, Bld: 151 mg/dL — ABNORMAL HIGH (ref 70–99)
Sodium: 131 mEq/L — ABNORMAL LOW (ref 135–145)
Total Protein: 7.6 g/dL (ref 6.0–8.3)

## 2011-08-02 LAB — CBC WITH DIFFERENTIAL/PLATELET
Basophils Absolute: 0 10*3/uL (ref 0.0–0.1)
Eosinophils Absolute: 0.1 10*3/uL (ref 0.0–0.5)
HGB: 13.3 g/dL (ref 11.6–15.9)
MCV: 93.5 fL (ref 79.5–101.0)
MONO#: 0.8 10*3/uL (ref 0.1–0.9)
NEUT#: 2 10*3/uL (ref 1.5–6.5)
Platelets: 231 10*3/uL (ref 145–400)
RBC: 4.13 10*6/uL (ref 3.70–5.45)
RDW: 19.6 % — ABNORMAL HIGH (ref 11.2–14.5)
WBC: 4.1 10*3/uL (ref 3.9–10.3)

## 2011-08-02 LAB — UA PROTEIN, DIPSTICK - CHCC: Protein, Urine: 30 mg/dL

## 2011-08-09 ENCOUNTER — Ambulatory Visit
Admission: RE | Admit: 2011-08-09 | Discharge: 2011-08-09 | Disposition: A | Discharge status: Home / Self Care | Payer: No Typology Code available for payment source | Source: Ambulatory Visit | Attending: Oncology | Admitting: Oncology

## 2011-08-09 ENCOUNTER — Other Ambulatory Visit: Payer: Self-pay | Admitting: Oncology

## 2011-08-09 DIAGNOSIS — C189 Malignant neoplasm of colon, unspecified: Secondary | ICD-10-CM

## 2011-08-09 MED ORDER — IOHEXOL 300 MG/ML  SOLN
100.0000 mL | Freq: Once | INTRAMUSCULAR | Status: AC | PRN
  Administered 2011-08-09: 100 mL via INTRAVENOUS

## 2011-08-12 ENCOUNTER — Encounter (HOSPITAL_BASED_OUTPATIENT_CLINIC_OR_DEPARTMENT_OTHER): Payer: PRIVATE HEALTH INSURANCE | Admitting: Oncology

## 2011-08-12 DIAGNOSIS — Z452 Encounter for adjustment and management of vascular access device: Secondary | ICD-10-CM

## 2011-08-12 DIAGNOSIS — C787 Secondary malignant neoplasm of liver and intrahepatic bile duct: Secondary | ICD-10-CM

## 2011-08-12 DIAGNOSIS — C180 Malignant neoplasm of cecum: Secondary | ICD-10-CM

## 2011-08-13 ENCOUNTER — Ambulatory Visit
Admission: RE | Admit: 2011-08-13 | Discharge: 2011-08-13 | Disposition: A | Discharge status: Home / Self Care | Payer: PRIVATE HEALTH INSURANCE | Source: Ambulatory Visit | Attending: Radiation Oncology | Admitting: Radiation Oncology

## 2011-08-13 DIAGNOSIS — R918 Other nonspecific abnormal finding of lung field: Secondary | ICD-10-CM

## 2011-08-13 DIAGNOSIS — C787 Secondary malignant neoplasm of liver and intrahepatic bile duct: Secondary | ICD-10-CM

## 2011-08-13 DIAGNOSIS — C180 Malignant neoplasm of cecum: Secondary | ICD-10-CM

## 2011-08-17 ENCOUNTER — Ambulatory Visit (HOSPITAL_COMMUNITY)
Admission: RE | Admit: 2011-08-17 | Discharge: 2011-08-17 | Disposition: A | Discharge status: Home / Self Care | Payer: PRIVATE HEALTH INSURANCE | Source: Ambulatory Visit | Attending: Oncology | Admitting: Oncology

## 2011-08-17 DIAGNOSIS — R000 Tachycardia, unspecified: Secondary | ICD-10-CM | POA: Insufficient documentation

## 2011-08-17 DIAGNOSIS — I079 Rheumatic tricuspid valve disease, unspecified: Secondary | ICD-10-CM | POA: Insufficient documentation

## 2011-08-23 ENCOUNTER — Other Ambulatory Visit: Payer: Self-pay | Admitting: Oncology

## 2011-08-23 ENCOUNTER — Encounter (HOSPITAL_BASED_OUTPATIENT_CLINIC_OR_DEPARTMENT_OTHER): Payer: Self-pay | Admitting: Oncology

## 2011-08-23 DIAGNOSIS — C189 Malignant neoplasm of colon, unspecified: Secondary | ICD-10-CM

## 2011-08-30 ENCOUNTER — Other Ambulatory Visit: Payer: Self-pay | Admitting: Oncology

## 2011-08-30 ENCOUNTER — Encounter (HOSPITAL_BASED_OUTPATIENT_CLINIC_OR_DEPARTMENT_OTHER): Payer: Self-pay | Admitting: Oncology

## 2011-08-30 DIAGNOSIS — C787 Secondary malignant neoplasm of liver and intrahepatic bile duct: Secondary | ICD-10-CM

## 2011-08-30 DIAGNOSIS — R918 Other nonspecific abnormal finding of lung field: Secondary | ICD-10-CM

## 2011-08-30 DIAGNOSIS — R000 Tachycardia, unspecified: Secondary | ICD-10-CM

## 2011-08-30 DIAGNOSIS — Z452 Encounter for adjustment and management of vascular access device: Secondary | ICD-10-CM

## 2011-08-30 DIAGNOSIS — R5381 Other malaise: Secondary | ICD-10-CM

## 2011-08-30 DIAGNOSIS — C180 Malignant neoplasm of cecum: Secondary | ICD-10-CM

## 2011-08-30 LAB — CBC WITH DIFFERENTIAL/PLATELET
BASO%: 0.2 % (ref 0.0–2.0)
HCT: 37.3 % (ref 34.8–46.6)
LYMPH%: 15 % (ref 14.0–49.7)
MCH: 29.8 pg (ref 25.1–34.0)
MCHC: 32.2 g/dL (ref 31.5–36.0)
MCV: 92.6 fL (ref 79.5–101.0)
MONO#: 1.3 10*3/uL — ABNORMAL HIGH (ref 0.1–0.9)
NEUT%: 73.9 % (ref 38.4–76.8)
Platelets: 311 10*3/uL (ref 145–400)
WBC: 12.4 10*3/uL — ABNORMAL HIGH (ref 3.9–10.3)

## 2011-08-30 LAB — COMPREHENSIVE METABOLIC PANEL
Albumin: 2.9 g/dL — ABNORMAL LOW (ref 3.5–5.2)
Alkaline Phosphatase: 410 U/L — ABNORMAL HIGH (ref 39–117)
BUN: 8 mg/dL (ref 6–23)
Calcium: 9 mg/dL (ref 8.4–10.5)
Glucose, Bld: 117 mg/dL — ABNORMAL HIGH (ref 70–99)
Potassium: 4.5 mEq/L (ref 3.5–5.3)

## 2011-09-08 ENCOUNTER — Other Ambulatory Visit: Payer: Self-pay | Admitting: Oncology

## 2011-09-08 ENCOUNTER — Encounter (HOSPITAL_BASED_OUTPATIENT_CLINIC_OR_DEPARTMENT_OTHER): Payer: PRIVATE HEALTH INSURANCE | Admitting: Oncology

## 2011-09-08 DIAGNOSIS — C180 Malignant neoplasm of cecum: Secondary | ICD-10-CM

## 2011-09-08 DIAGNOSIS — Z452 Encounter for adjustment and management of vascular access device: Secondary | ICD-10-CM

## 2011-09-08 DIAGNOSIS — R000 Tachycardia, unspecified: Secondary | ICD-10-CM

## 2011-09-08 DIAGNOSIS — C787 Secondary malignant neoplasm of liver and intrahepatic bile duct: Secondary | ICD-10-CM

## 2011-09-08 DIAGNOSIS — R918 Other nonspecific abnormal finding of lung field: Secondary | ICD-10-CM

## 2011-09-08 LAB — COMPREHENSIVE METABOLIC PANEL
ALT: 24 U/L (ref 0–35)
CO2: 28 mEq/L (ref 19–32)
Calcium: 9.7 mg/dL (ref 8.4–10.5)
Chloride: 94 mEq/L — ABNORMAL LOW (ref 96–112)
Potassium: 3.9 mEq/L (ref 3.5–5.3)
Sodium: 134 mEq/L — ABNORMAL LOW (ref 135–145)
Total Protein: 9.1 g/dL — ABNORMAL HIGH (ref 6.0–8.3)

## 2011-09-08 LAB — CBC WITH DIFFERENTIAL/PLATELET
Basophils Absolute: 0 10*3/uL (ref 0.0–0.1)
Eosinophils Absolute: 0 10*3/uL (ref 0.0–0.5)
HGB: 12.1 g/dL (ref 11.6–15.9)
MCV: 92.1 fL (ref 79.5–101.0)
MONO#: 1.4 10*3/uL — ABNORMAL HIGH (ref 0.1–0.9)
NEUT#: 11.3 10*3/uL — ABNORMAL HIGH (ref 1.5–6.5)
RDW: 16.7 % — ABNORMAL HIGH (ref 11.2–14.5)
lymph#: 1.2 10*3/uL (ref 0.9–3.3)

## 2011-09-08 LAB — URINALYSIS, MICROSCOPIC - CHCC
Blood: NEGATIVE
Glucose: NEGATIVE g/dL
Leukocyte Esterase: NEGATIVE
Protein: 30 mg/dL

## 2011-09-08 LAB — UA PROTEIN, DIPSTICK - CHCC: Protein, Urine: <30 mg/dL

## 2011-09-10 LAB — URINE CULTURE

## 2011-09-22 ENCOUNTER — Encounter: Payer: Self-pay | Admitting: *Deleted

## 2011-09-22 ENCOUNTER — Ambulatory Visit (INDEPENDENT_AMBULATORY_CARE_PROVIDER_SITE_OTHER): Payer: PRIVATE HEALTH INSURANCE | Admitting: Cardiovascular Disease

## 2011-09-22 ENCOUNTER — Telehealth: Payer: Self-pay | Admitting: *Deleted

## 2011-09-22 ENCOUNTER — Encounter: Payer: Self-pay | Admitting: Cardiovascular Disease

## 2011-09-22 VITALS — BP 116/84 | HR 116 | Ht 66.0 in | Wt 142.0 lb

## 2011-09-22 DIAGNOSIS — R7989 Other specified abnormal findings of blood chemistry: Secondary | ICD-10-CM

## 2011-09-22 DIAGNOSIS — R000 Tachycardia, unspecified: Secondary | ICD-10-CM

## 2011-09-22 DIAGNOSIS — I429 Cardiomyopathy, unspecified: Secondary | ICD-10-CM | POA: Insufficient documentation

## 2011-09-22 LAB — CBC WITH DIFFERENTIAL/PLATELET
Basophils Relative: 0.4 % (ref 0.0–3.0)
Eosinophils Absolute: 0 10*3/uL (ref 0.0–0.7)
Eosinophils Relative: 0.1 % (ref 0.0–5.0)
HCT: 34.9 % — ABNORMAL LOW (ref 36.0–46.0)
Lymphs Abs: 1 10*3/uL (ref 0.7–4.0)
MCHC: 32.4 g/dL (ref 30.0–36.0)
MCV: 92.7 fl (ref 78.0–100.0)
Monocytes Absolute: 0.9 10*3/uL (ref 0.1–1.0)
Neutro Abs: 9.4 10*3/uL — ABNORMAL HIGH (ref 1.4–7.7)
Neutrophils Relative %: 83 % — ABNORMAL HIGH (ref 43.0–77.0)
RBC: 3.77 Mil/uL — ABNORMAL LOW (ref 3.87–5.11)

## 2011-09-22 LAB — BASIC METABOLIC PANEL
CO2: 26 mEq/L (ref 19–32)
Chloride: 99 mEq/L (ref 96–112)
Creatinine, Ser: 0.6 mg/dL (ref 0.4–1.2)
Potassium: 3.9 mEq/L (ref 3.5–5.1)

## 2011-09-22 LAB — PROTIME-INR: INR: 1.3 ratio — ABNORMAL HIGH (ref 0.8–1.0)

## 2011-09-22 NOTE — Assessment & Plan Note (Signed)
Sinus tachycardia. She does not use stimulants. No evidence of infection, fever or pain. No pericardial effusion on recent echo. No recent w/u for PE. Will check D-dimer today. No chest pain or SOB but given wall motion abnormality on echo and strong FH of CAD, will plan right and left heart cath to exclude CAD. There is mild pulm HTN on the echo. Orthostatic vitals today suggestive of

## 2011-09-22 NOTE — Patient Instructions (Signed)
Your physician recommends that you schedule a follow-up appointment in: 3 weeks.   Your physician has requested that you have a cardiac catheterization. Cardiac catheterization is used to diagnose and/or treat various heart conditions. Doctors may recommend this procedure for a number of different reasons. The most common reason is to evaluate chest pain. Chest pain can be a symptom of coronary artery disease (CAD), and cardiac catheterization can show whether plaque is narrowing or blocking your heart's arteries. This procedure is also used to evaluate the valves, as well as measure the blood flow and oxygen levels in different parts of your heart. For further information please visit [email or web address] Please follow instruction sheet, as given.  Scheduled for September 29, 2011

## 2011-09-22 NOTE — Assessment & Plan Note (Signed)
Her anterior wall appears hypokinetic on echo. Not sure if this is related to her tachycardia. Will plan Left heart cath to exclude obstructive CAD.

## 2011-09-22 NOTE — Telephone Encounter (Signed)
Elevated d-dimer. Per Dr. Clifton James pt will need chest CTA. I called and spoke with pt and gave her this information. She will come in for CT tomorrow at 9:30. She is aware to be npo 2 hours prior to test.

## 2011-09-22 NOTE — Progress Notes (Signed)
History of Present Illness:65 yo AAF with history of HTN, metastatic colon cancer here today for evaluatioin of tachycardia. Her cancer is treated by Dr. Mancel Bale. She has been on chemotherapy but this has been on hold with her tachycardia. She has mets to her liver, lungs and sacrum.  Her TSH, cortisol and BMET have been within normal limits. All documentation suggests sinus tachycardia. The heart rate has resolved with IV fluid administration. Echo on 08/17/11 with normal LV size with LVEF of 45-50%, moderate hypokinesis of the anterior wall. She reports feeling tired all of the time. She has right shoulder pain. No chest pain or SOB. She has had no lower extremity swelling. No prior PE workup that I can see. She avoids caffeine and does not smoke. Her echo did not show a pericardial effusion. No prior h/o DVT. She reports poor oral intake lately.   Orthostatic vital signs in our office today are abnormal.  BP lying flat: 121/84     Sitting: 130/84    Standing: 103/71. Pulse flat:   113            Sitting: 110         Standing: 123  Past Medical History  Diagnosis Date  . Colon cancer     colon ca dx 2/08    Past Surgical History  Procedure Date  . Cholecystectomy 1975  . Tubal ligation 1975  . Colectomy 2008  . Ovary surgery 2008  . Liver biopsy     Current Outpatient Prescriptions  Medication Sig Dispense Refill  . NON FORMULARY IRON 65 mg  1 tab three times a day       . OVER THE COUNTER MEDICATION Stool softner 2 tabs daily       . oxycodone (OXY-IR) 5 MG capsule Take 5 mg by mouth every 4 (four) hours as needed.        . potassium chloride SA (K-DUR,KLOR-CON) 20 MEQ tablet Take 20 mEq by mouth 2 (two) times daily.          No Known Allergies  History   Social History  . Marital Status: Married    Spouse Name: N/A    Number of Children: 2  . Years of Education: N/A   Occupational History  . Retired    Social History Main Topics  . Smoking status: Never Smoker   .  Smokeless tobacco: Not on file  . Alcohol Use: Not on file  . Drug Use: Not on file  . Sexually Active: Not on file   Other Topics Concern  . Not on file   Social History Narrative  . No narrative on file    Family History  Problem Relation Age of Onset  . Colon cancer Brother   . Diabetes Mother   . Hypertension Father   . Heart attack Brother     Age 80, died MI    Review of Systems:  As stated in the HPI and otherwise negative.   BP 122/81  Pulse 122  Ht 5\' 6"  (1.676 m)  Wt 142 lb (64.411 kg)  BMI 22.92 kg/m2  Physical Examination: General: Well developed, well nourished, NAD HEENT: OP clear, mucus membranes moist SKIN: warm, dry. No rashes. Neuro: No focal deficits Musculoskeletal: Muscle strength 5/5 all ext Psychiatric: Mood and affect normal Neck: No JVD, no carotid bruits, no thyromegaly, no lymphadenopathy. Lungs:Clear bilaterally, no wheezes, rhonci, crackles Cardiovascular: Regular rate and rhythm. No murmurs, gallops or rubs. Abdomen:Soft. Bowel sounds present.  Non-tender.  Extremities: No lower extremity edema. Pulses are 2 + in the bilateral DP/PT.  BJY:NWGNF tachycardia, rate 121 bpm. Otherwise normal ekg.

## 2011-09-23 ENCOUNTER — Ambulatory Visit (INDEPENDENT_AMBULATORY_CARE_PROVIDER_SITE_OTHER)
Admission: RE | Admit: 2011-09-23 | Discharge: 2011-09-23 | Disposition: A | Discharge status: Home / Self Care | Payer: PRIVATE HEALTH INSURANCE | Source: Ambulatory Visit | Attending: Cardiovascular Disease | Admitting: Cardiovascular Disease

## 2011-09-23 DIAGNOSIS — R791 Abnormal coagulation profile: Secondary | ICD-10-CM

## 2011-09-23 DIAGNOSIS — R7989 Other specified abnormal findings of blood chemistry: Secondary | ICD-10-CM

## 2011-09-23 DIAGNOSIS — R000 Tachycardia, unspecified: Secondary | ICD-10-CM

## 2011-09-23 MED ORDER — IOHEXOL 300 MG/ML  SOLN
80.0000 mL | Freq: Once | INTRAMUSCULAR | Status: AC | PRN
  Administered 2011-09-23: 80 mL via INTRAVENOUS

## 2011-09-28 ENCOUNTER — Emergency Department (HOSPITAL_COMMUNITY)
Admission: EM | Admit: 2011-09-28 | Discharge: 2011-09-28 | Disposition: A | Discharge status: Home / Self Care | Payer: PRIVATE HEALTH INSURANCE | Attending: Emergency Medicine | Admitting: Emergency Medicine

## 2011-09-28 ENCOUNTER — Emergency Department (HOSPITAL_COMMUNITY): Payer: PRIVATE HEALTH INSURANCE

## 2011-09-28 ENCOUNTER — Telehealth: Payer: Self-pay | Admitting: Cardiovascular Disease

## 2011-09-28 DIAGNOSIS — N390 Urinary tract infection, site not specified: Secondary | ICD-10-CM | POA: Insufficient documentation

## 2011-09-28 DIAGNOSIS — R110 Nausea: Secondary | ICD-10-CM | POA: Insufficient documentation

## 2011-09-28 DIAGNOSIS — I498 Other specified cardiac arrhythmias: Secondary | ICD-10-CM | POA: Insufficient documentation

## 2011-09-28 DIAGNOSIS — Z85038 Personal history of other malignant neoplasm of large intestine: Secondary | ICD-10-CM | POA: Insufficient documentation

## 2011-09-28 DIAGNOSIS — Z79899 Other long term (current) drug therapy: Secondary | ICD-10-CM | POA: Insufficient documentation

## 2011-09-28 LAB — CBC
HCT: 33.7 % — ABNORMAL LOW (ref 36.0–46.0)
Hemoglobin: 11.8 g/dL — ABNORMAL LOW (ref 12.0–15.0)
MCH: 30.8 pg (ref 26.0–34.0)
MCHC: 35 g/dL (ref 30.0–36.0)
MCV: 88 fL (ref 78.0–100.0)

## 2011-09-28 LAB — URINALYSIS, ROUTINE W REFLEX MICROSCOPIC
Glucose, UA: NEGATIVE mg/dL
Ketones, ur: 15 mg/dL — AB
Specific Gravity, Urine: 1.026 (ref 1.005–1.030)
pH: 5 (ref 5.0–8.0)

## 2011-09-28 LAB — DIFFERENTIAL
Basophils Relative: 0 % (ref 0–1)
Lymphocytes Relative: 4 % — ABNORMAL LOW (ref 12–46)
Lymphs Abs: 0.7 10*3/uL (ref 0.7–4.0)
Monocytes Absolute: 1.6 10*3/uL — ABNORMAL HIGH (ref 0.1–1.0)
Monocytes Relative: 10 % (ref 3–12)
Neutro Abs: 13.6 10*3/uL — ABNORMAL HIGH (ref 1.7–7.7)

## 2011-09-28 LAB — POCT I-STAT, CHEM 8
Calcium, Ion: 1.09 mmol/L — ABNORMAL LOW (ref 1.12–1.32)
Creatinine, Ser: 0.8 mg/dL (ref 0.50–1.10)
Glucose, Bld: 148 mg/dL — ABNORMAL HIGH (ref 70–99)
Hemoglobin: 13.3 g/dL (ref 12.0–15.0)
TCO2: 26 mmol/L (ref 0–100)

## 2011-09-28 LAB — URINE MICROSCOPIC-ADD ON

## 2011-09-28 NOTE — Telephone Encounter (Signed)
Pt wants to talk to you about pulse 162. She has procedure scheduled for tomorrow. Please call

## 2011-09-28 NOTE — Telephone Encounter (Signed)
Number busy. Will continue to try and reach pt.

## 2011-09-28 NOTE — Telephone Encounter (Signed)
Spoke with pt and husband. Pt reports episode of feeling sweaty and nauseated yesterday but no chest pain. Lasted about an hour. Heart rate was around 105 at that time.  Today blood pressure was 92/60 with

## 2011-09-28 NOTE — Telephone Encounter (Signed)
Note continued   Heart rate of 162.  She is not aware of increased heart rate. She rechecked heart rate while on phone with me and it was 133.  She has been trying to increase fluid intake but having difficulty doing so. She is scheduled for right and left heart cath in [facility] lab tomorrow.  I instructed pt and husband that pt should go to ED at [facility] to be evaluated.  They do not feel they need to call EMS and husband will transport her.  Trish notified.

## 2011-09-29 ENCOUNTER — Inpatient Hospital Stay (HOSPITAL_BASED_OUTPATIENT_CLINIC_OR_DEPARTMENT_OTHER)
Admission: RE | Admit: 2011-09-29 | Discharge: 2011-09-29 | Disposition: A | Discharge status: Home / Self Care | Payer: PRIVATE HEALTH INSURANCE | Source: Ambulatory Visit | Attending: Cardiovascular Disease | Admitting: Cardiovascular Disease

## 2011-09-29 ENCOUNTER — Encounter: Payer: Self-pay | Admitting: *Deleted

## 2011-09-29 DIAGNOSIS — I498 Other specified cardiac arrhythmias: Secondary | ICD-10-CM | POA: Insufficient documentation

## 2011-09-29 DIAGNOSIS — C7800 Secondary malignant neoplasm of unspecified lung: Secondary | ICD-10-CM | POA: Insufficient documentation

## 2011-09-29 DIAGNOSIS — I10 Essential (primary) hypertension: Secondary | ICD-10-CM | POA: Insufficient documentation

## 2011-09-29 DIAGNOSIS — R9389 Abnormal findings on diagnostic imaging of other specified body structures: Secondary | ICD-10-CM

## 2011-09-29 DIAGNOSIS — C189 Malignant neoplasm of colon, unspecified: Secondary | ICD-10-CM | POA: Insufficient documentation

## 2011-09-29 DIAGNOSIS — C787 Secondary malignant neoplasm of liver and intrahepatic bile duct: Secondary | ICD-10-CM | POA: Insufficient documentation

## 2011-09-29 LAB — URINE CULTURE: Colony Count: 9000

## 2011-09-29 LAB — POCT I-STAT 3, VENOUS BLOOD GAS (G3P V)
O2 Saturation: 76 %
pCO2, Ven: 37.7 mmHg — ABNORMAL LOW (ref 45.0–50.0)
pH, Ven: 7.424 — ABNORMAL HIGH (ref 7.250–7.300)

## 2011-09-29 NOTE — Cardiovascular Report (Signed)
NAMEMARVEEN, Sylvia Walsh NO.:  [id number]  MEDICAL RECORD NO.:  [id number]  LOCATION:  [facility]                         FACILITY:  [facility]  PHYSICIAN:  Verne Carrow, MDDATE OF BIRTH:  08-10-1946  DATE OF PROCEDURE:  09/29/2011 DATE OF DISCHARGE:  09/28/2011                           CARDIAC CATHETERIZATION   PRIMARY ONCOLOGIST:  Ladene Artist, MD  PRIMARY CARE PHYSICIAN:  Quita Skye. Kindl, MD  PROCEDURE PERFORMED: 1. Left heart catheterization. 2. Right heart catheterization. 3. Selective coronary angiography. 4. Left ventricular angiogram.  OPERATOR:  Verne Carrow, MD  INDICATION:  This is a 64 year old African American female with a history of hypertension and metastatic colon cancer to her liver and lungs who I evaluated in the office last week because of sinus tachycardia.  Echocardiogram on August 17, 2011, showed normal left ventricular size with mild left ventricular systolic dysfunction with ejection fraction of 45% with hypokinesis of the anterior wall.  The patient has been noted multiple times to have sinus tachycardia.  She actually appeared to be orthostatics in our Cardiology office last week. I recommended pushing her intake of fluids.  Because of her abnormal echocardiogram and the tachycardia, I felt that it was most important to exclude coronary artery disease as one of the potential causes of her tachycardia.  I also cannot explain her wall motion abnormality on the echocardiogram.  The echocardiogram also suggested pulmonary hypertension.  Diagnostic right and left heart catheterization was planned for today.  DETAILS OF PROCEDURE:  The patient was brought to the outpatient cardiac catheterization laboratory after signing informed consent for the procedure.  The right groin was prepped and draped in a sterile fashion. Lidocaine 1% was used for local anesthesia.  A 4-French sheath was inserted into the right femoral artery  without difficulty.  A 6-French sheath was inserted into the right femoral vein without difficulty.  A multipurpose catheter was used to perform right heart catheterization. Standard diagnostic catheters were used to perform selective coronary angiography.  A pigtail catheter was used to perform a left ventricular angiogram.  The patient tolerated the procedure well.  There were no immediate complications.  The patient was taken to the recovery room in stable condition.  HEMODYNAMIC FINDINGS:  Central aortic pressure 106/61, left ventricular pressure 103/6/10, right atrial pressure 5, right ventricular pressure 26/6/8, pulmonary artery pressure 28/14/21, pulmonary capillary wedge pressure 8.  Central aortic saturation 96%, pulmonary artery saturation 76%, cardiac output 7 L/minute, cardiac index 4.1 L/min/m2.  ANGIOGRAPHIC FINDINGS: 1. The left main coronary artery had no evidence of disease. 2. Circumflex artery gave off 3 small obtuse marginal branches.  This     was a moderate-sized vessel.  There was no disease noted in this     vessel. 3. The left anterior descending artery is a large vessel that courses     to the apex and gives off several small diagonal branches.  There     is no disease in this system.  There is a moderate size ramus     intermediate branch with no evidence of disease. 4. The right coronary artery is a moderate sized codominant vessel     with no evidence of  disease. 5. Left ventricular angiogram was performed in the RAO projection and     shows mild left ventricular systolic dysfunction with hypokinesis     of the anteroapical wall.  Ejection fraction is estimated at 45%.  IMPRESSION: 1. No angiographic evidence of coronary artery disease. 2. 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 3. Sinus tachycardia.  RECOMMENDATIONS:  The patient presented to the emergency  room yesterday with supraventricular tachycardia with heart rate in the 150s.  She currently has sinus tachycardia with a heart rate of 110 here in the cath lab.  I will review the dosage of her beta-blocker that was started in the emergency department yesterday and will adjust accordingly.  I will follow up in the office in 1 week.     Verne Carrow, MD     CM/MEDQ  D:  09/29/2011  T:  09/29/2011  Job:  [id number]  cc:   Ladene Artist, M.D. Quita Skye Artis Flock, M.D.  Electronically Signed by Verne Carrow MD on 09/29/2011 03:51:37 PM

## 2011-09-29 NOTE — Telephone Encounter (Signed)
I have spoken to the patient this am. She is here in the cath lab. cdm

## 2011-10-12 ENCOUNTER — Encounter (HOSPITAL_BASED_OUTPATIENT_CLINIC_OR_DEPARTMENT_OTHER): Payer: PRIVATE HEALTH INSURANCE | Admitting: Oncology

## 2011-10-12 DIAGNOSIS — C801 Malignant (primary) neoplasm, unspecified: Secondary | ICD-10-CM

## 2011-10-12 DIAGNOSIS — C785 Secondary malignant neoplasm of large intestine and rectum: Secondary | ICD-10-CM

## 2011-10-12 DIAGNOSIS — R918 Other nonspecific abnormal finding of lung field: Secondary | ICD-10-CM

## 2011-10-12 DIAGNOSIS — C787 Secondary malignant neoplasm of liver and intrahepatic bile duct: Secondary | ICD-10-CM

## 2011-10-15 ENCOUNTER — Other Ambulatory Visit: Payer: Self-pay | Admitting: Oncology

## 2011-10-15 ENCOUNTER — Encounter (HOSPITAL_BASED_OUTPATIENT_CLINIC_OR_DEPARTMENT_OTHER): Payer: PRIVATE HEALTH INSURANCE | Admitting: Oncology

## 2011-10-15 DIAGNOSIS — C787 Secondary malignant neoplasm of liver and intrahepatic bile duct: Secondary | ICD-10-CM

## 2011-10-15 DIAGNOSIS — R918 Other nonspecific abnormal finding of lung field: Secondary | ICD-10-CM

## 2011-10-15 DIAGNOSIS — Z452 Encounter for adjustment and management of vascular access device: Secondary | ICD-10-CM

## 2011-10-15 DIAGNOSIS — C180 Malignant neoplasm of cecum: Secondary | ICD-10-CM

## 2011-10-15 LAB — COMPREHENSIVE METABOLIC PANEL
AST: 135 U/L — ABNORMAL HIGH (ref 0–37)
Albumin: 2 g/dL — ABNORMAL LOW (ref 3.5–5.2)
BUN: 8 mg/dL (ref 6–23)
Calcium: 9.7 mg/dL (ref 8.4–10.5)
Chloride: 93 mEq/L — ABNORMAL LOW (ref 96–112)
Creatinine, Ser: 0.73 mg/dL (ref 0.50–1.10)
Glucose, Bld: 120 mg/dL — ABNORMAL HIGH (ref 70–99)
Potassium: 3.9 mEq/L (ref 3.5–5.3)

## 2011-10-18 ENCOUNTER — Ambulatory Visit (INDEPENDENT_AMBULATORY_CARE_PROVIDER_SITE_OTHER): Payer: PRIVATE HEALTH INSURANCE | Admitting: Cardiovascular Disease

## 2011-10-18 ENCOUNTER — Telehealth: Payer: Self-pay | Admitting: Cardiovascular Disease

## 2011-10-18 ENCOUNTER — Encounter: Payer: Self-pay | Admitting: Cardiovascular Disease

## 2011-10-18 VITALS — BP 104/79 | HR 125 | Resp 12 | Wt 136.0 lb

## 2011-10-18 DIAGNOSIS — R000 Tachycardia, unspecified: Secondary | ICD-10-CM

## 2011-10-18 MED ORDER — METOPROLOL TARTRATE 50 MG PO TABS: ORAL_TABLET | ORAL | Status: DC

## 2011-10-18 MED ORDER — METOPROLOL TARTRATE 50 MG PO TABS: ORAL_TABLET | ORAL | Status: AC

## 2011-10-18 NOTE — Progress Notes (Signed)
History of Present Illness:65 yo AAF with history of HTN, metastatic colon cancer here today for cardiac follow up. I saw her three weeks ago for evaluatioin of tachycardia. Her cancer is treated by Dr. Mancel Bale. She has been on chemotherapy. She has mets to her liver, lungs and sacrum. Her TSH, cortisol and BMET have been within normal limits. All documentation suggests sinus tachycardia. Echo on 08/17/11 with normal LV size with LVEF of 45-50%, moderate hypokinesis of the anterior wall. AT her first visit in my office in October, she reported fatigue, right shoulder pain. No chest pain or SOB. She has had no lower extremity swelling. She avoids caffeine and does not smoke. Her echo did not show a pericardial effusion. No prior h/o DVT. Her po intake had been down. She was mildly orthostatic in our office. I ordered a d-dimer which was elevated. CTA chest with no evidence of PE but she did have noted marked progression of metastatic disease in bilateral lungs as well as an epicardial mass and several masses near the right atrium and left ventricle. Right and left heart cath on October 17,2012 with no evidence of CAD, mild LV dysfunction presumed to be from a Non-ischemic cardiomyopathy, possibly from chemotherapy.   She feels poorly. No energy. Denies chest pain or SOB. She denies palpitatoins. She was seen by Dr. Truett Perna last week and plans are being made to restart chemotherapy.    Past Medical History  Diagnosis Date  . Colon cancer     colon ca dx 2/08    Past Surgical History  Procedure Date  . Cholecystectomy 1975  . Tubal ligation 1975  . Colectomy 2008  . Ovary surgery 2008  . Liver biopsy     Current Outpatient Prescriptions  Medication Sig Dispense Refill  . NON FORMULARY IRON 65 mg  1 tab three times a day       . OVER THE COUNTER MEDICATION Stool softner 2 tabs daily       . oxycodone (OXY-IR) 5 MG capsule Take 5 mg by mouth every 4 (four) hours as needed.        .  potassium chloride SA (K-DUR,KLOR-CON) 20 MEQ tablet Take 20 mEq by mouth 2 (two) times daily.          No Known Allergies  History   Social History  . Marital Status: Married    Spouse Name: N/A    Number of Children: 2  . Years of Education: N/A   Occupational History  . Retired    Social History Main Topics  . Smoking status: Never Smoker   . Smokeless tobacco: Not on file  . Alcohol Use: Not on file  . Drug Use: Not on file  . Sexually Active: Not on file   Other Topics Concern  . Not on file   Social History Narrative  . No narrative on file    Family History  Problem Relation Age of Onset  . Colon cancer Brother   . Diabetes Mother   . Hypertension Father   . Heart attack Brother     Age 105, died MI    Review of Systems:  As stated in the HPI and otherwise negative.   BP 104/79  Pulse 125  Resp 12  Wt 136 lb (61.689 kg)  Physical Examination: General: Well developed, well nourished, NAD HEENT: OP clear, mucus membranes moist SKIN: warm, dry. No rashes. Neuro: No focal deficits Musculoskeletal: Muscle strength 5/5 all ext Psychiatric: Mood and  affect normal Neck: No JVD, no carotid bruits, no thyromegaly, no lymphadenopathy. Lungs:Clear bilaterally, no wheezes, rhonci, crackles Cardiovascular: Regular rate and rhythm. No murmurs, gallops or rubs. Abdomen:Soft. Bowel sounds present. Non-tender.  Extremities: No lower extremity edema. Pulses are 2 + in the bilateral DP/PT.  Cardiac Cath 09/29/11:   HEMODYNAMIC FINDINGS: Central aortic pressure 106/61, left ventricular  pressure 103/6/10, right atrial pressure 5, right ventricular pressure  26/6/8, pulmonary artery pressure 28/14/21, pulmonary capillary wedge  pressure 8. Central aortic saturation 96%, pulmonary artery saturation  76%, cardiac output 7 L/minute, cardiac index 4.1 L/min/m2.   ANGIOGRAPHIC FINDINGS:  1. The left main coronary artery had no evidence of disease.  2. Circumflex  artery gave off 3 small obtuse marginal branches. This  was a moderate-sized vessel. There was no disease noted in this  vessel.  3. The left anterior descending artery is a large vessel that courses  to the apex and gives off several small diagonal branches. There  is no disease in this system. There is a moderate size ramus  intermediate branch with no evidence of disease.  4. The right coronary artery is a moderate sized codominant vessel  with no evidence of disease.  5. Left ventricular angiogram was performed in the RAO projection and  shows mild left ventricular systolic dysfunction with hypokinesis  of the anteroapical wall. Ejection fraction is estimated at 45%.  IMPRESSION:  1. No angiographic evidence of coronary artery disease.  2. Mild left ventricular systolic dysfunction, presumed to be from a  nonischemic cardiomyopathy, possibly related to her chemotherapy.  3. Sinus tachycardia.  Chest CTA: 10/10!2:   Findings: There are no pulmonary emboli. However, the patient has developed significant progression of metastatic disease in the chest with numerous new bilateral pulmonary nodules. There is a 4.8 x 4.7 x 2.1 cm nodal mass lying inferior to the right atrium and left ventricle. There is also a 0.2 x 1.8 x 2.1 cm anterior right epicardial mass.  The mass in the right lower lobe has increased to 2.7 x 1.7 cm. There is a new 7.4 mm nodule in the right lower lobe on image number 41 of series 7. There is an 8 mm nodule in the left upper lobe on image number 25. There are numerous smaller new pulmonary nodules bilaterally.  Heart size is normal.  Again noted is extensive metastatic disease in the liver. No infiltrates or effusions. No significant hilar or mediastinal adenopathy. No focal bone lesions.  Review of the MIP images confirms the above findings.  IMPRESSION:  1. No acute pulmonary emboli. 2. Marked progression of metastatic disease in the chest.

## 2011-10-18 NOTE — Telephone Encounter (Signed)
Pt spouse rtn your call to give you the medication that is needed

## 2011-10-18 NOTE — Telephone Encounter (Signed)
Addended by: Dossie Arbour on: 10/18/2011 02:15 PM   Modules accepted: Orders

## 2011-10-18 NOTE — Telephone Encounter (Signed)
Spoke with pt's husband and clarified pt is taking metoprolol tartrate 50 mg by mouth twice daily. He is aware to increase this to 75 mg twice daily. Will send new prescriptions to [facility] on [address].

## 2011-10-18 NOTE — Patient Instructions (Addendum)
Your physician recommends that you schedule a follow-up appointment in 6 weeks--November 23, 2011 at 12:00  Call us today to let us know your dose of metoprolol--Our number is [phone]

## 2011-10-18 NOTE — Assessment & Plan Note (Signed)
Sinus tachycardia. I believe this is likely a combination of dehydration along with some irritation from her cardiac mets. I have encouraged her to continue to push fluids. She should be drinking 8 glasses of water per day. I will increase her metoprolol to 75 mg po BID. There was no evidence of PE on CTA last month.

## 2011-10-18 NOTE — Telephone Encounter (Signed)
Addended by: Dossie Arbour on: 10/18/2011 02:09 PM   Modules accepted: Orders

## 2011-10-21 ENCOUNTER — Encounter: Payer: Self-pay | Admitting: *Deleted

## 2011-10-21 NOTE — Progress Notes (Signed)
RECEIVED A FAX FROM [facility] CONCERNING A PRESCRIPTION REFILL REQUEST FOR STIVARGA. THIS REQUEST WAS GIVEN TO DR.SHERRILL'S NURSE, SUSAN COWARD,RN.

## 2011-10-22 ENCOUNTER — Telehealth: Payer: Self-pay | Admitting: *Deleted

## 2011-10-22 MED ORDER — LACTULOSE 10 GM/15ML PO SOLN: 20.0000 g | Freq: Every day | ORAL | Status: AC

## 2011-10-22 NOTE — Telephone Encounter (Signed)
PT.'S STIVARGA HAS BEEN DISCONTINUED DUE TO ELEVATED LIVER FUNCTIONS.

## 2011-10-22 NOTE — Telephone Encounter (Signed)
Patient called to request something for bowels. Has already been using MiraLax daily, MOM and Senokot without much success. Per Dr. Truett Perna, will try Lactulose--take 30cc tid till good BM then 30 cc daily. Patient notified and agrees.

## 2011-10-25 ENCOUNTER — Other Ambulatory Visit: Payer: Self-pay | Admitting: Oncology

## 2011-10-26 ENCOUNTER — Telehealth: Payer: Self-pay | Admitting: *Deleted

## 2011-10-26 NOTE — Telephone Encounter (Signed)
Husband reports that [facility] did not get record of Lactulose script that was called in Friday evening. Called pharmacy on [address] and called in script again (they report new computer system). Husband reports that she sees Dr. Carmon Ginsberg on 10/29/11.

## 2011-10-28 ENCOUNTER — Other Ambulatory Visit: Payer: Self-pay | Admitting: *Deleted

## 2011-10-28 NOTE — Telephone Encounter (Signed)
Call from pt's husband requesting refill on Potassium. Per Dr. Truett Perna: Discontinue potassium. No refill needed. Pt and husband made aware.

## 2011-10-29 ENCOUNTER — Other Ambulatory Visit: Payer: Self-pay | Admitting: *Deleted

## 2011-10-29 DIAGNOSIS — C189 Malignant neoplasm of colon, unspecified: Secondary | ICD-10-CM

## 2011-10-29 MED ORDER — POTASSIUM CHLORIDE CRYS ER 20 MEQ PO TBCR: 20.0000 meq | EXTENDED_RELEASE_TABLET | Freq: Two times a day (BID) | ORAL | Status: DC

## 2011-10-29 NOTE — Telephone Encounter (Signed)
PRESCRIPTION REFILL FAXED TO [facility].

## 2011-11-01 ENCOUNTER — Other Ambulatory Visit (HOSPITAL_BASED_OUTPATIENT_CLINIC_OR_DEPARTMENT_OTHER): Payer: PRIVATE HEALTH INSURANCE | Admitting: Lab

## 2011-11-01 ENCOUNTER — Other Ambulatory Visit: Payer: Self-pay | Admitting: Oncology

## 2011-11-01 ENCOUNTER — Ambulatory Visit: Payer: PRIVATE HEALTH INSURANCE | Admitting: Oncology

## 2011-11-01 ENCOUNTER — Ambulatory Visit (HOSPITAL_BASED_OUTPATIENT_CLINIC_OR_DEPARTMENT_OTHER): Payer: PRIVATE HEALTH INSURANCE | Admitting: Oncology

## 2011-11-01 ENCOUNTER — Telehealth: Payer: Self-pay | Admitting: Oncology

## 2011-11-01 ENCOUNTER — Telehealth: Payer: Self-pay | Admitting: *Deleted

## 2011-11-01 VITALS — BP 109/74 | HR 101 | Temp 98.0°F | Wt 135.7 lb

## 2011-11-01 DIAGNOSIS — C189 Malignant neoplasm of colon, unspecified: Secondary | ICD-10-CM

## 2011-11-01 DIAGNOSIS — R000 Tachycardia, unspecified: Secondary | ICD-10-CM

## 2011-11-01 DIAGNOSIS — C787 Secondary malignant neoplasm of liver and intrahepatic bile duct: Secondary | ICD-10-CM

## 2011-11-01 DIAGNOSIS — C180 Malignant neoplasm of cecum: Secondary | ICD-10-CM

## 2011-11-01 DIAGNOSIS — N9489 Other specified conditions associated with female genital organs and menstrual cycle: Secondary | ICD-10-CM

## 2011-11-01 LAB — COMPREHENSIVE METABOLIC PANEL
AST: 97 U/L — ABNORMAL HIGH (ref 0–37)
Albumin: 1.8 g/dL — ABNORMAL LOW (ref 3.5–5.2)
BUN: 10 mg/dL (ref 6–23)
CO2: 25 mEq/L (ref 19–32)
Calcium: 9.3 mg/dL (ref 8.4–10.5)
Chloride: 97 mEq/L (ref 96–112)
Creatinine, Ser: 0.68 mg/dL (ref 0.50–1.10)
Glucose, Bld: 125 mg/dL — ABNORMAL HIGH (ref 70–99)
Potassium: 3.5 mEq/L (ref 3.5–5.3)

## 2011-11-01 LAB — CBC WITH DIFFERENTIAL/PLATELET
Basophils Absolute: 0 10*3/uL (ref 0.0–0.1)
Eosinophils Absolute: 0 10*3/uL (ref 0.0–0.5)
HCT: 30.4 % — ABNORMAL LOW (ref 34.8–46.6)
HGB: 10.2 g/dL — ABNORMAL LOW (ref 11.6–15.9)
MCH: 30.4 pg (ref 25.1–34.0)
MONO#: 0.8 10*3/uL (ref 0.1–0.9)
NEUT#: 10.3 10*3/uL — ABNORMAL HIGH (ref 1.5–6.5)
NEUT%: 83.1 % — ABNORMAL HIGH (ref 38.4–76.8)
RDW: 19.3 % — ABNORMAL HIGH (ref 11.2–14.5)
lymph#: 1.2 10*3/uL (ref 0.9–3.3)

## 2011-11-01 NOTE — Telephone Encounter (Signed)
Call from Trudie Buckler with [employer]---patient's insurance carrier requires precert for all chemotherapy. He is working on treatment from 07/14/2011 and needs call to process this. Forwarded this message to Thelma Barge in managed care department to follow up on this call as well as inquire into getting the Regorafenib started asap (this week if possible). Copy of script forwarded to managed care dept.

## 2011-11-01 NOTE — Progress Notes (Signed)
Gave patient printed material on Regorafenib to review with her husband.

## 2011-11-01 NOTE — Telephone Encounter (Signed)
gve the pt her dec 2012 appt calendar °

## 2011-11-02 ENCOUNTER — Other Ambulatory Visit: Payer: Self-pay | Admitting: *Deleted

## 2011-11-02 DIAGNOSIS — C189 Malignant neoplasm of colon, unspecified: Secondary | ICD-10-CM | POA: Insufficient documentation

## 2011-11-02 DIAGNOSIS — C787 Secondary malignant neoplasm of liver and intrahepatic bile duct: Secondary | ICD-10-CM | POA: Insufficient documentation

## 2011-11-02 NOTE — Progress Notes (Signed)
OFFICE PROGRESS NOTE   INTERVAL HISTORY:   Ms. Goodell returns as scheduled. She saw Dr. Carmon Ginsberg on November 16. He recommends treatment with regorafenib. She has noted some improvement in her appetite and energy level over the past few weeks. She has no new complaints.  Objective:  Vital signs in last 24 hours:  Blood pressure 109/74, pulse 101, temperature 98 F (36.7 C), temperature source Oral, weight 135 lb 11.2 oz (61.553 kg).    HEENT: The sclerae are icteric. No thrush. Resp: Distant breath sounds. Clear bilaterally. Cardio: Regular rate and rhythm GI: The abdomen is soft and nontender. There is fullness in the right upper quadrant without a discrete liver edge. Vascular: No leg edema.    Portacath/PICC-without erythema  Lab Results:  CBC  Lab Results  Component Value Date   WBC 12.4* 11/01/2011   HGB 10.2* 11/01/2011   HCT 30.4* 11/01/2011   MCV 90.5 11/01/2011   PLT 496* 11/01/2011    Chemistry:      Component Value Date/Time   NA 131* 11/01/2011 1447   NA 131* 11/01/2011 1447   K 3.5 11/01/2011 1447   K 3.5 11/01/2011 1447   CL 97 11/01/2011 1447   CL 97 11/01/2011 1447   CO2 25 11/01/2011 1447   CO2 25 11/01/2011 1447   GLUCOSE 125* 11/01/2011 1447   GLUCOSE 125* 11/01/2011 1447   BUN 10 11/01/2011 1447   BUN 10 11/01/2011 1447   CREATININE 0.68 11/01/2011 1447   CREATININE 0.68 11/01/2011 1447   CALCIUM 9.3 11/01/2011 1447   CALCIUM 9.3 11/01/2011 1447   PROT 9.0* 11/01/2011 1447   PROT 9.0* 11/01/2011 1447   ALBUMIN 1.8* 11/01/2011 1447   ALBUMIN 1.8* 11/01/2011 1447   AST 97* 11/01/2011 1447   AST 97* 11/01/2011 1447   ALT 35 11/01/2011 1447   ALT 35 11/01/2011 1447   ALKPHOS 556* 11/01/2011 1447   ALKPHOS 556* 11/01/2011 1447   BILITOT 8.4* 11/01/2011 1447   BILITOT 8.4* 11/01/2011 1447     Medications: I have reviewed the patient's current medications.  Assessment/Plan:  1. Metastatic colon cancer.  She began salvage therapy  with FOLFIRI/Avastin on 07/20/2010.  A restaging CT 10/13/2010 confirmed a decrease in the right lower lobe nodule and stable to decreased size of liver metastases compared to the CT from 07/13/2010.   a. CT 01/19/2011 confirmed a decrease in the size of multiple liver metastases and a decrease in the size of the right lower lobe nodule with no evidence for new metastatic disease.   b. CT 05/04/2011 showed a stable right lower lobe nodule with no evidence of new metastatic disease. c. CT 08/09/2011 showed stable disease and no new findings.   d. CT of the chest 09/23/2011 reveals a 4.8-cm nodal mass inferior to the right atrium and left ventricle.  There is also a 2.1-cm anterior right epicardial mass.  Numerous small new pulmonary nodules. 2. History of rectal bleeding, likely related to hemorrhoids, potentially complicated by Avastin therapy. 3. History of hypertension related to Avastin. 4. Right hip/buttock pain secondary to a metastatic lesion at the right sacrum, resolved.  She completed palliative radiation 07/07/2011. 5. Tachycardia, intermittent tachycardia over the past year.  An extensive diagnostic evaluation for the tachycardia was unrevealing including a TSH, cortisol, and BNP.  She has been evaluated by Dr. Clifton James.  This included a negative CT for pulmonary embolism and a recent cardiac catheterization.  I am concerned the tachycardia may be related to the pericardial  mass. The tachycardia has improved with medical therapy. 6. Profound malaise, likely secondary to progression of the metastatic colon cancer. 7.     2D echocardiogram 08/17/2011 with mildly reduced systolic function with an estimated ejection fraction in the range of 45% to 50%.  Possible moderate hypokinesis of the anterior myocardium.  The right ventricular systolic pressure was increased consistent with mild pulmonary hypertension.  No pericardial effusion   Disposition:  Ms. Garciagarcia has advanced metastatic colon  cancer. The disease has progressed despite multiple systemic therapy regimens. Treatment options are limited. She saw Dr. Bufford Buttner on November 16 and she is not eligible for a clinical trial. Dr. Carmon Ginsberg recommends treatment with regorafenib. Ms. Lashley understands the potential for increased toxicity with this agent given the underlying liver enzyme abnormalities. She understands the increased potential for toxicities such as mucositis, skin rash, diarrhea, and hepatitis. She would like to proceed with therapy.  The plan is to initiate salvage therapy with regorafenib within the next one week. She will return for an office in last visit in 2 weeks.  We began a discussion regarding goals of care. I discussed CPR and ACLS issues with Ms. Carreras and her husband. She is not ready to make a decision on CODE STATUS today. We also discussed the [facility] hospice program. The plan is to make a hospice referral if she has disease progression on regorafenib.   Lucile Shutters, MD  11/02/2011  7:49 AM

## 2011-11-03 ENCOUNTER — Telehealth: Payer: Self-pay | Admitting: *Deleted

## 2011-11-03 ENCOUNTER — Other Ambulatory Visit: Payer: Self-pay | Admitting: *Deleted

## 2011-11-03 DIAGNOSIS — C189 Malignant neoplasm of colon, unspecified: Secondary | ICD-10-CM

## 2011-11-03 MED ORDER — OXYCODONE HCL 5 MG PO CAPS: 5.0000 mg | ORAL_CAPSULE | ORAL | Status: DC | PRN

## 2011-11-03 NOTE — Telephone Encounter (Signed)
Husband called to pick up refill for pain med for wife. 2nd copy of script printed in error--copy in shreader.

## 2011-11-03 NOTE — Telephone Encounter (Signed)
Call from [place], [place] --must dispense Stivarga 40mg  in multiples of #28 in pack and quantity MD requested was #42. Gave OK to dispense #2 packs

## 2011-11-05 ENCOUNTER — Other Ambulatory Visit: Payer: Self-pay | Admitting: *Deleted

## 2011-11-05 ENCOUNTER — Telehealth: Payer: Self-pay | Admitting: *Deleted

## 2011-11-05 NOTE — Telephone Encounter (Signed)
NO NOTE

## 2011-11-05 NOTE — Telephone Encounter (Signed)
MAY ONLY SEND 28 TABS[14 DAY SUPPLY] DUE TO PT.'S INSURANCE COVERAGE. THE PHARMACY WILL CONSULT PT. CONCERNING SPLIT DOSING SUPPLY. UPDATE PHARMACY ON PT.'S TOLERANT OF THE STIVARGA.

## 2011-11-15 ENCOUNTER — Ambulatory Visit (HOSPITAL_BASED_OUTPATIENT_CLINIC_OR_DEPARTMENT_OTHER): Payer: PRIVATE HEALTH INSURANCE

## 2011-11-15 ENCOUNTER — Ambulatory Visit (HOSPITAL_BASED_OUTPATIENT_CLINIC_OR_DEPARTMENT_OTHER): Payer: PRIVATE HEALTH INSURANCE | Admitting: Oncology

## 2011-11-15 VITALS — BP 119/86 | HR 126 | Temp 97.0°F | Ht 66.0 in | Wt 140.6 lb

## 2011-11-15 DIAGNOSIS — R5381 Other malaise: Secondary | ICD-10-CM

## 2011-11-15 DIAGNOSIS — C787 Secondary malignant neoplasm of liver and intrahepatic bile duct: Secondary | ICD-10-CM

## 2011-11-15 DIAGNOSIS — C180 Malignant neoplasm of cecum: Secondary | ICD-10-CM

## 2011-11-15 DIAGNOSIS — C189 Malignant neoplasm of colon, unspecified: Secondary | ICD-10-CM

## 2011-11-15 LAB — MAGNESIUM: Magnesium: 1.9 mg/dL (ref 1.5–2.5)

## 2011-11-15 LAB — CBC WITH DIFFERENTIAL/PLATELET
BASO%: 0 % (ref 0.0–2.0)
Basophils Absolute: 0 10*3/uL (ref 0.0–0.1)
EOS%: 0.1 % (ref 0.0–7.0)
HCT: 37.7 % (ref 34.8–46.6)
HGB: 12.7 g/dL (ref 11.6–15.9)
MCH: 31.6 pg (ref 25.1–34.0)
MONO#: 1.2 10*3/uL — ABNORMAL HIGH (ref 0.1–0.9)
NEUT%: 80.2 % — ABNORMAL HIGH (ref 38.4–76.8)
RDW: 18.2 % — ABNORMAL HIGH (ref 11.2–14.5)
WBC: 14.4 10*3/uL — ABNORMAL HIGH (ref 3.9–10.3)
lymph#: 1.6 10*3/uL (ref 0.9–3.3)

## 2011-11-15 LAB — COMPREHENSIVE METABOLIC PANEL
ALT: 30 U/L (ref 0–35)
AST: 107 U/L — ABNORMAL HIGH (ref 0–37)
Albumin: 2.1 g/dL — ABNORMAL LOW (ref 3.5–5.2)
BUN: 23 mg/dL (ref 6–23)
CO2: 23 mEq/L (ref 19–32)
Calcium: 8.3 mg/dL — ABNORMAL LOW (ref 8.4–10.5)
Chloride: 102 mEq/L (ref 96–112)
Potassium: 4.7 mEq/L (ref 3.5–5.3)

## 2011-11-15 NOTE — Progress Notes (Signed)
OFFICE PROGRESS NOTE   INTERVAL HISTORY:   Sylvia Walsh returns as scheduled. She started regorafenib one week ago. She has noted an increased frequency of bowel movements, but no diarrhea. Her mouth and lips are dry. No ulcers. She has noted an increased appetite and energy level over the past few weeks.  Objective:  Vital signs in last 24 hours:  Blood pressure 119/86, pulse 126, temperature 97 F (36.1 C), temperature source Oral, height 5\' 6"  (1.676 m), weight 140 lb 9.6 oz (63.776 kg).    HEENT: The mucous membranes are moist. The lips are dry. No ulcers. No thrush. The sclera are icteric. Resp: Decreased breath sounds at the right greater than left lower chest. No respiratory distress. Cardio: Tachycardia, regular rate and rhythm GI: There is fullness in the right upper abdomen with mild associated tenderness Vascular: No leg edema  Skin: No rash   Portacath/PICC-without erythema  Lab Results:  CBC  Lab Results  Component Value Date   WBC 14.4* 11/15/2011   HGB 12.7 11/15/2011   HCT 37.7 11/15/2011   MCV 93.6 11/15/2011   PLT 414* 11/15/2011    Medications: I have reviewed the patient's current medications.  Assessment/Plan: 1. Metastatic colon cancer. She began salvage therapy with FOLFIRI/Avastin on 07/20/2010. A restaging CT 10/13/2010 confirmed a decrease in the right lower lobe nodule and stable to decreased size of liver metastases compared to the CT from 07/13/2010.  a. CT 01/19/2011 confirmed a decrease in the size of multiple liver metastases and a decrease in the size of the right lower lobe nodule with no evidence for new metastatic disease.  b. CT 05/04/2011 showed a stable right lower lobe nodule with no evidence of new metastatic disease. c. CT 08/09/2011 showed stable disease and no new findings.  d. CT of the chest 09/23/2011 reveals a 4.8-cm nodal mass inferior to the right atrium and left ventricle. There is also a 2.1-cm anterior right epicardial mass.  Numerous small new pulmonary nodules. e. She began regorafenib on 11/08/2011 2. History of rectal bleeding, likely related to hemorrhoids, potentially complicated by Avastin therapy. 3. History of hypertension related to Avastin. 4. Right hip/buttock pain secondary to a metastatic lesion at the right sacrum, resolved. She completed palliative radiation 07/07/2011. 5. Tachycardia, intermittent tachycardia over the past year. An extensive diagnostic evaluation for the tachycardia was unrevealing including a TSH, cortisol, and BNP. She has been evaluated by Dr. Clifton James. This included a negative CT for pulmonary embolism and a recent cardiac catheterization. I am concerned the tachycardia may be related to the pericardial mass. The tachycardia has partially improved with medical therapy. 6. Profound malaise, likely secondary to progression of the metastatic colon cancer. She has noted some improvement over the past few weeks       7.   2D echocardiogram 08/17/2011 with mildly reduced systolic function with an estimated ejection fraction in the range of 45% to 50%. Possible moderate hypokinesis of the anterior myocardium. The right ventricular systolic pressure was increased consistent with mild pulmonary hypertension. No pericardial effusion    Disposition:  Sylvia Walsh appears stable. She will continue regorafenib for 21/28 days. She is taking regorafenib at a reduced dose. She will return for an opposite and lab visit in 2 weeks. She will contact us in the interim for new symptoms.   Lucile Shutters, MD  11/15/2011  2:57 PM

## 2011-11-16 ENCOUNTER — Other Ambulatory Visit: Payer: PRIVATE HEALTH INSURANCE

## 2011-11-16 ENCOUNTER — Ambulatory Visit: Payer: PRIVATE HEALTH INSURANCE | Admitting: Oncology

## 2011-11-17 ENCOUNTER — Encounter: Payer: Self-pay | Admitting: *Deleted

## 2011-11-19 ENCOUNTER — Telehealth: Payer: Self-pay | Admitting: *Deleted

## 2011-11-19 DIAGNOSIS — K1231 Oral mucositis (ulcerative) due to antineoplastic therapy: Secondary | ICD-10-CM

## 2011-11-19 MED ORDER — MAGIC MOUTHWASH: 5.0000 mL | Freq: Four times a day (QID) | ORAL | Status: AC | PRN

## 2011-11-19 NOTE — Telephone Encounter (Signed)
Call from pt reporting her mouth is getting sore. Also having small formed bowel movement with every trip to the restroom. She reports her rectal area has become "raw".  Suggested she try a barrier for rectal area with some type of diaper rash ointment or even petroleum jelly for relief. Instructed pt to try Imodium to slow stools in order to give area time to heal. Pt verbalized understanding.   Reviewed with Misty Stanley, NP. Order received to call in [employer].

## 2011-11-23 ENCOUNTER — Ambulatory Visit: Payer: PRIVATE HEALTH INSURANCE | Admitting: Cardiovascular Disease

## 2011-11-24 ENCOUNTER — Other Ambulatory Visit: Payer: Self-pay | Admitting: *Deleted

## 2011-11-24 NOTE — Telephone Encounter (Signed)
A prior authorization fax was received today.  Request to Managed Care.  [email or web address],  Key: [id number]

## 2011-11-26 ENCOUNTER — Other Ambulatory Visit: Payer: Self-pay | Admitting: *Deleted

## 2011-11-26 DIAGNOSIS — C189 Malignant neoplasm of colon, unspecified: Secondary | ICD-10-CM

## 2011-11-26 MED ORDER — REGORAFENIB 40 MG PO TABS: 80.0000 mg | ORAL_TABLET | Freq: Every day | ORAL | Status: DC

## 2011-11-26 NOTE — Telephone Encounter (Signed)
RECEIVED A FAX FROM [facility] CONCERNING A PRESCRIPTION REFILL REQUEST. THIS REQUEST WAS GIVEN TO DR.SHERRILL'S NURSE, SUSAN COWARD,RN.

## 2011-11-29 ENCOUNTER — Ambulatory Visit: Payer: PRIVATE HEALTH INSURANCE | Admitting: Oncology

## 2011-11-29 ENCOUNTER — Other Ambulatory Visit: Payer: PRIVATE HEALTH INSURANCE

## 2011-11-29 ENCOUNTER — Other Ambulatory Visit: Payer: Self-pay | Admitting: *Deleted

## 2011-11-29 ENCOUNTER — Telehealth: Payer: Self-pay | Admitting: *Deleted

## 2011-11-29 DIAGNOSIS — C189 Malignant neoplasm of colon, unspecified: Secondary | ICD-10-CM

## 2011-11-29 MED ORDER — REGORAFENIB 40 MG PO TABS: 80.0000 mg | ORAL_TABLET | Freq: Every day | ORAL | Status: DC

## 2011-11-29 NOTE — Telephone Encounter (Signed)
Patient reports having too much rectal pain to come to appointment today. Significant pain with bowel movements. Denies diarrhea, but has 8-9 BM's/day (all formed). Not eating well. Reminded her that when she feels bad is when the MD needs to see her. She agrees to reschedule for tomorrow.

## 2011-11-30 ENCOUNTER — Other Ambulatory Visit (HOSPITAL_BASED_OUTPATIENT_CLINIC_OR_DEPARTMENT_OTHER): Payer: PRIVATE HEALTH INSURANCE | Admitting: Lab

## 2011-11-30 ENCOUNTER — Ambulatory Visit (HOSPITAL_BASED_OUTPATIENT_CLINIC_OR_DEPARTMENT_OTHER): Payer: PRIVATE HEALTH INSURANCE | Admitting: Nurse Practitioner

## 2011-11-30 ENCOUNTER — Telehealth: Payer: Self-pay | Admitting: Oncology

## 2011-11-30 VITALS — BP 85/60 | HR 98 | Temp 96.1°F | Ht 66.0 in | Wt 140.5 lb

## 2011-11-30 DIAGNOSIS — C787 Secondary malignant neoplasm of liver and intrahepatic bile duct: Secondary | ICD-10-CM

## 2011-11-30 DIAGNOSIS — L270 Generalized skin eruption due to drugs and medicaments taken internally: Secondary | ICD-10-CM

## 2011-11-30 DIAGNOSIS — C189 Malignant neoplasm of colon, unspecified: Secondary | ICD-10-CM

## 2011-11-30 DIAGNOSIS — I318 Other specified diseases of pericardium: Secondary | ICD-10-CM

## 2011-11-30 DIAGNOSIS — R000 Tachycardia, unspecified: Secondary | ICD-10-CM

## 2011-11-30 LAB — COMPREHENSIVE METABOLIC PANEL
ALT: 46 U/L — ABNORMAL HIGH (ref 0–35)
AST: 211 U/L — ABNORMAL HIGH (ref 0–37)
Albumin: <2 g/dL — ABNORMAL LOW (ref 3.5–5.2)
Alkaline Phosphatase: 650 U/L — ABNORMAL HIGH (ref 39–117)
BUN: 29 mg/dL — ABNORMAL HIGH (ref 6–23)
Potassium: 4.3 mEq/L (ref 3.5–5.3)
Sodium: 136 mEq/L (ref 135–145)
Total Protein: 8.3 g/dL (ref 6.0–8.3)

## 2011-11-30 LAB — CBC WITH DIFFERENTIAL/PLATELET
BASO%: 0.2 % (ref 0.0–2.0)
EOS%: 0.1 % (ref 0.0–7.0)
MCH: 31.3 pg (ref 25.1–34.0)
MCV: 90.4 fL (ref 79.5–101.0)
MONO%: 5.1 % (ref 0.0–14.0)
RBC: 4.18 10*6/uL (ref 3.70–5.45)
RDW: 18.5 % — ABNORMAL HIGH (ref 11.2–14.5)
lymph#: 1.5 10*3/uL (ref 0.9–3.3)

## 2011-11-30 NOTE — Telephone Encounter (Signed)
gve the pt her dec 2012 appt calendar °

## 2011-11-30 NOTE — Progress Notes (Signed)
OFFICE PROGRESS NOTE  Interval history:  Sylvia Walsh is a 65 year old woman with metastatic colon cancer. She began regorafenib on 11/08/2011. She is seen today after missing a scheduled followup visit yesterday.  [name] reports significant pain with bowel movements over the past one week. She is having multiple formed stools daily. She estimates 4-5 stools in a 24-hour period. She intermittently notes bright red blood with bowel movements. She has significant pain with sitting. Her husband has noticed that the skin surrounding the anus is markedly erythematous. She denies nausea or vomiting. Her mouth is sore. She denies any ulcers. Appetite is poor. She is spending the majority of the day in bed.   Objective: Blood pressure 85/60, pulse 98, temperature 96.1 F (35.6 C), temperature source Oral, height 5\' 6"  (1.676 m), weight 140 lb 8 oz (63.73 kg).  Oropharynx is without thrush or ulceration. The posterior palate has a jaundice appearance. Scleral icterus. Lungs clear. No wheezes or rales. Regular cardiac rhythm. Port-A-Cath site is without erythema. Abdomen is soft. Full over the right upper quadrant. Extremities are without edema. Superficial skin breakdown/ulceration at the perineum/perianal region and extending onto the buttocks. External hemorrhoids. Rectal exam was attempted but unsuccessful due to severe pain at the anal sphincter.   Lab Results: Lab Results  Component Value Date   WBC 10.9* 11/30/2011   HGB 13.1 11/30/2011   HCT 37.8 11/30/2011   MCV 90.4 11/30/2011   PLT 328 11/30/2011       Studies/Results: No results found.  Medications: I have reviewed the patient's current medications.  Assessment/Plan: 1. Metastatic colon cancer. She began salvage therapy with FOLFIRI/Avastin on 07/20/2010. A restaging CT 10/13/2010 confirmed a decrease in the right lower lobe nodule and stable to decreased size of liver metastases compared to the CT from 07/13/2010.  a. CT  01/19/2011 confirmed a decrease in the size of multiple liver metastases and a decrease in the size of the right lower lobe nodule with no evidence for new metastatic disease.  b. CT 05/04/2011 showed a stable right lower lobe nodule with no evidence of new metastatic disease. c. CT 08/09/2011 showed stable disease and no new findings.  d. CT of the chest 09/23/2011 reveals a 4.8-cm nodal mass inferior to the right atrium and left ventricle. There is also a 2.1-cm anterior right epicardial mass. Numerous small new pulmonary nodules. e. She began regorafenib on 11/08/2011. 2. History of rectal bleeding, likely related to hemorrhoids, potentially complicated by Avastin therapy. 3. History of hypertension related to Avastin. 4. Right hip/buttock pain secondary to a metastatic lesion at the right sacrum, resolved. She completed palliative radiation 07/07/2011. 5. Tachycardia, intermittent tachycardia over the past year. An extensive diagnostic evaluation for the tachycardia was unrevealing including a TSH, cortisol, and BNP. She has been evaluated by Dr. Clifton James. This included a negative CT for pulmonary embolism and a recent cardiac catheterization. I am concerned the tachycardia may be related to the pericardial mass. The tachycardia has partially improved with medical therapy. 6. Profound malaise, likely secondary to progression of the metastatic colon cancer. She has noted some improvement over the past few weeks       7. 2D echocardiogram 08/17/2011 with mildly reduced systolic function with an estimated ejection fraction in the range of               45% to 50%. Possible moderate hypokinesis of the anterior myocardium. The right ventricular systolic pressure was  increased consistent with mild pulmonary hypertension. No pericardial effusion.       8.   Skin breakdown at the perineum/perianal region extending to the buttocks-likely skin toxicity related to regorafenib. She was given  instructions to discontinue regorafenib. She will begin sitz baths, Anusol HC Suppositories, and apply a barrier creams such as Desitin as needed.   Disposition-[name] will discontinue regorafenib as above. She will return for a followup visit on 12/06/2011. She will contact the office the interim with any problems.  Patient seen with Dr. Truett Perna.    Lonna Cobb ANP/GNP-BC

## 2011-12-06 ENCOUNTER — Ambulatory Visit (HOSPITAL_BASED_OUTPATIENT_CLINIC_OR_DEPARTMENT_OTHER): Payer: PRIVATE HEALTH INSURANCE | Admitting: Nurse Practitioner

## 2011-12-06 ENCOUNTER — Other Ambulatory Visit: Payer: Self-pay | Admitting: *Deleted

## 2011-12-06 ENCOUNTER — Telehealth: Payer: Self-pay | Admitting: Oncology

## 2011-12-06 VITALS — BP 119/76 | HR 139 | Temp 97.0°F | Ht 66.0 in | Wt 142.6 lb

## 2011-12-06 DIAGNOSIS — C787 Secondary malignant neoplasm of liver and intrahepatic bile duct: Secondary | ICD-10-CM

## 2011-12-06 DIAGNOSIS — L988 Other specified disorders of the skin and subcutaneous tissue: Secondary | ICD-10-CM

## 2011-12-06 DIAGNOSIS — C189 Malignant neoplasm of colon, unspecified: Secondary | ICD-10-CM

## 2011-12-06 DIAGNOSIS — K625 Hemorrhage of anus and rectum: Secondary | ICD-10-CM

## 2011-12-06 MED ORDER — OXYCODONE HCL 5 MG PO CAPS: 5.0000 mg | ORAL_CAPSULE | ORAL | Status: AC | PRN

## 2011-12-06 NOTE — Telephone Encounter (Signed)
gve the pt her jan 2013 appt calendar °

## 2011-12-06 NOTE — Progress Notes (Signed)
OFFICE PROGRESS NOTE  Interval history:  Sylvia Walsh is a 65 year old woman with metastatic colon cancer. Following her visit 11/30/2011 regorafenib was placed on hold due to skin breakdown at the perineum/perianal region. She is seen today for scheduled followup.  Sylvia Walsh reports overall she is feeling better. She continues to have frequent bowel movements. The pain associated with bowel movements has improved. She continues to note mild rectal bleeding. Her mouth is less sore. She continues to have a poor appetite and poor energy level. She is drinking several cans of Ensure per day. Her husband report she continues to spend the majority of the day in bed.  She intermittently notes an increased heart rate. Her heart rate at home is typically 110. She denies chest pain or shortness of breath. She continues metoprolol twice daily.   Objective: Blood pressure 119/76, pulse 139, temperature 97 F (36.1 C), temperature source Oral, height 5\' 6"  (1.676 m), weight 142 lb 9.6 oz (64.683 kg).  White coating over tongue. Lungs clear. Breath sounds are diminished at the bases. Heart is regular, markedly tachycardic. Port-A-Cath site is without erythema. Abdomen is soft. Fullness over the right upper quadrant. Extremities are without edema. Superficial skin breakdown/ulceration at the gluteal fold, perianal region and the perineum appears significantly improved.   Lab Results: Lab Results  Component Value Date   WBC 10.9* 11/30/2011   HGB 13.1 11/30/2011   HCT 37.8 11/30/2011   MCV 90.4 11/30/2011   PLT 328 11/30/2011    Chemistry:    Chemistry      Component Value Date/Time   NA 136 11/30/2011 1114   K 4.3 11/30/2011 1114   CL 103 11/30/2011 1114   CO2 19 11/30/2011 1114   BUN 29* 11/30/2011 1114   CREATININE 1.80* 11/30/2011 1114      Component Value Date/Time   CALCIUM 7.8* 11/30/2011 1114   ALKPHOS 650* 11/30/2011 1114   AST 211* 11/30/2011 1114   ALT 46* 11/30/2011 1114   BILITOT 8.0* 11/30/2011 1114       Studies/Results: No results found.   Assessment/Plan: 1. Metastatic colon cancer. She began salvage therapy with FOLFIRI/Avastin on 07/20/2010. A restaging CT 10/13/2010 confirmed a decrease in the right lower lobe nodule and stable to decreased size of liver metastases compared to the CT from 07/13/2010.  a. CT 01/19/2011 confirmed a decrease in the size of multiple liver metastases and a decrease in the size of the right lower lobe nodule with no evidence for new metastatic disease.  b. CT 05/04/2011 showed a stable right lower lobe nodule with no evidence of new metastatic disease. c. CT 08/09/2011 showed stable disease and no new findings.  d. CT of the chest 09/23/2011 reveals a 4.8-cm nodal mass inferior to the right atrium and left ventricle. There is also a 2.1-cm anterior right epicardial mass. Numerous small new pulmonary nodules. e. She began regorafenib on 11/08/2011. Regorafenib was placed on hold 11/30/2011 due to skin toxicity. 2. History of rectal bleeding, likely related to hemorrhoids, potentially complicated by Avastin therapy. 3. History of hypertension related to Avastin. 4. Right hip/buttock pain secondary to a metastatic lesion at the right sacrum, resolved. She completed palliative radiation 07/07/2011. 5. Tachycardia, intermittent tachycardia over the past year. An extensive diagnostic evaluation for the tachycardia was unrevealing including a TSH, cortisol, and BNP. She has been evaluated by Dr. Clifton James. This included a negative CT for pulmonary embolism and a recent cardiac catheterization. Dr. Truett Perna is concerned the tachycardia may be related  to the pericardial mass. The tachycardia has partially improved with medical therapy. She continues to be intermittently tachycardic 6. Profound malaise, likely secondary to progression of the metastatic colon cancer.        7. 2D echocardiogram 08/17/2011 with mildly reduced systolic  function with an estimated ejection fraction in the range of               45% to 50%. Possible moderate hypokinesis of the anterior myocardium. The right ventricular systolic pressure was           increased consistent with mild pulmonary hypertension. No pericardial effusion.        8. Skin breakdown at the perineum/perianal region extending to the buttocks-likely skin toxicity related to regorafenib. Improved.  Disposition-the skin breakdown is better. Sylvia Walsh will continue to hold the Regorafenib. She will return for a followup visit 12/23/2011. She will contact the office in the interim with any problems.  Plan reviewed with Dr. Truett Perna.   Lonna Cobb ANP/GNP-BC

## 2011-12-23 ENCOUNTER — Ambulatory Visit (HOSPITAL_BASED_OUTPATIENT_CLINIC_OR_DEPARTMENT_OTHER): Payer: PRIVATE HEALTH INSURANCE | Admitting: Nurse Practitioner

## 2011-12-23 ENCOUNTER — Other Ambulatory Visit: Payer: PRIVATE HEALTH INSURANCE | Admitting: Lab

## 2011-12-23 ENCOUNTER — Telehealth: Payer: Self-pay | Admitting: Oncology

## 2011-12-23 ENCOUNTER — Ambulatory Visit: Payer: PRIVATE HEALTH INSURANCE | Admitting: Nurse Practitioner

## 2011-12-23 VITALS — BP 105/74 | HR 144 | Temp 97.3°F | Ht 66.0 in | Wt 146.1 lb

## 2011-12-23 DIAGNOSIS — C189 Malignant neoplasm of colon, unspecified: Secondary | ICD-10-CM

## 2011-12-23 LAB — COMPREHENSIVE METABOLIC PANEL
AST: 67 U/L — ABNORMAL HIGH (ref 0–37)
BUN: 15 mg/dL (ref 6–23)
CO2: 25 mEq/L (ref 19–32)
Calcium: 7.6 mg/dL — ABNORMAL LOW (ref 8.4–10.5)
Chloride: 99 mEq/L (ref 96–112)
Creatinine, Ser: 1 mg/dL (ref 0.50–1.10)

## 2011-12-23 LAB — CBC WITH DIFFERENTIAL/PLATELET
Basophils Absolute: 0 10*3/uL (ref 0.0–0.1)
EOS%: 0.8 % (ref 0.0–7.0)
HCT: 30.5 % — ABNORMAL LOW (ref 34.8–46.6)
HGB: 10.4 g/dL — ABNORMAL LOW (ref 11.6–15.9)
MCH: 31.2 pg (ref 25.1–34.0)
NEUT%: 76.1 % (ref 38.4–76.8)
lymph#: 2.3 10*3/uL (ref 0.9–3.3)

## 2011-12-23 NOTE — Telephone Encounter (Signed)
appt made for 1/24,card to pt   aom

## 2011-12-23 NOTE — Progress Notes (Signed)
OFFICE PROGRESS NOTE  Interval history:  Sylvia Walsh is a 66 year old woman with metastatic colon cancer. Following her visit 11/30/2011 regorafenib was placed on hold due to skin breakdown at the perineum/perianal region. The skin breakdown was improved on 12/06/2011.  Ms. Figuero reports overall she is feeling better. The skin breakdown at the perineum/perianal region continues to improve. Bowels moving regularly. The pain associated with bowel movements has improved. She is taking less pain medication. She continues to note mild rectal bleeding. Appetite continues to be poor. She has Megace at home but is not taking it. She denies nausea but states that she would feel better if she could vomit.  She reports a normal heart rate at home. She denies chest pain or shortness of breath. She continues metoprolol twice daily.   Objective: Blood pressure 105/74, pulse 144, temperature 97.3 F (36.3 C), temperature source Oral, height 5\' 6"  (1.676 m), weight 146 lb 1.6 oz (66.271 kg).  Scleral icterus. Lungs clear. Breath sounds are diminished at the bases. Heart is regular, markedly tachycardic. Port-A-Cath site is without erythema. Abdomen is soft. Fullness over the right upper quadrant. Extremities are without edema. No skin breakdown at the gluteal fold, perianal region. External hemorrhoids noted.  Lab Results: Lab Results  Component Value Date   WBC 15.9* 12/23/2011   HGB 10.4* 12/23/2011   HCT 30.5* 12/23/2011   MCV 91.9 12/23/2011   PLT 390 12/23/2011    Chemistry:    Chemistry      Component Value Date/Time   NA 136 11/30/2011 1114   K 4.3 11/30/2011 1114   CL 103 11/30/2011 1114   CO2 19 11/30/2011 1114   BUN 29* 11/30/2011 1114   CREATININE 1.80* 11/30/2011 1114      Component Value Date/Time   CALCIUM 7.8* 11/30/2011 1114   ALKPHOS 650* 11/30/2011 1114   AST 211* 11/30/2011 1114   ALT 46* 11/30/2011 1114   BILITOT 8.0* 11/30/2011 1114       Studies/Results: No results  found.   Assessment/Plan: 1. Metastatic colon cancer. She began salvage therapy with FOLFIRI/Avastin on 07/20/2010. A restaging CT 10/13/2010 confirmed a decrease in the right lower lobe nodule and stable to decreased size of liver metastases compared to the CT from 07/13/2010.  a. CT 01/19/2011 confirmed a decrease in the size of multiple liver metastases and a decrease in the size of the right lower lobe nodule with no evidence for new metastatic disease.  b. CT 05/04/2011 showed a stable right lower lobe nodule with no evidence of new metastatic disease. c. CT 08/09/2011 showed stable disease and no new findings.  d. CT of the chest 09/23/2011 reveals a 4.8-cm nodal mass inferior to the right atrium and left ventricle. There is also a 2.1-cm anterior right epicardial mass. Numerous small new pulmonary nodules. e. She began regorafenib on 11/08/2011. Regorafenib was placed on hold 11/30/2011 due to skin toxicity. 2. History of rectal bleeding, likely related to hemorrhoids, potentially complicated by Avastin therapy. 3. History of hypertension related to Avastin. 4. Right hip/buttock pain secondary to a metastatic lesion at the right sacrum, resolved. She completed palliative radiation 07/07/2011. 5. Tachycardia, intermittent tachycardia over the past year. An extensive diagnostic evaluation for the tachycardia was unrevealing including a TSH, cortisol, and BNP. She has been evaluated by Dr. Clifton James. This included a negative CT for pulmonary embolism and a recent cardiac catheterization. Dr. Truett Perna is concerned the tachycardia may be related to the pericardial mass. The tachycardia has partially improved with  medical therapy. She continues to be intermittently tachycardic 6. Profound malaise, likely secondary to progression of the metastatic colon cancer.  7. 2-D echocardiogram 08/17/2011 with mildly reduced systolic function with an estimated ejection fraction in the range of 45-50%. Possible  moderate hypokinesis of the anterior myocardium. The right ventricular systolic pressure was increased consistent with mild pulmonary hypertension. No pericardial effusion. 8. Skin breakdown at the perineum/perianal region-likely skin toxicity related to Regorafenib. Significantly improved. 9. Anemia-we will repeat a CBC when she returns in 2 weeks.  Disposition-Ms. Jelinek appears improved. The skin breakdown is significantly better. She will resume Regorafenib at a reduced dose of 40 mg daily. She will return for a followup visit in 2 weeks. She will contact the office the interim with any problems.   Plan reviewed with Dr. Truett Perna.   Lonna Cobb ANP/GNP-BC

## 2011-12-29 ENCOUNTER — Other Ambulatory Visit: Payer: Self-pay | Admitting: *Deleted

## 2011-12-29 DIAGNOSIS — C189 Malignant neoplasm of colon, unspecified: Secondary | ICD-10-CM

## 2011-12-29 MED ORDER — REGORAFENIB 40 MG PO TABS: 40.0000 mg | ORAL_TABLET | Freq: Every day | ORAL | Status: AC

## 2011-12-29 NOTE — Telephone Encounter (Signed)
Call from pharmacy to confirm dose had decreased to 40mg  daily and needed to know reason. Was told due to skin toxcicity, which has now resolved. Was required to call in new script due to dose change and was required to call in #28 tabs due to packaging.

## 2012-01-06 ENCOUNTER — Other Ambulatory Visit: Payer: PRIVATE HEALTH INSURANCE

## 2012-01-06 ENCOUNTER — Ambulatory Visit: Payer: PRIVATE HEALTH INSURANCE

## 2012-01-06 ENCOUNTER — Ambulatory Visit: Payer: PRIVATE HEALTH INSURANCE | Admitting: Nurse Practitioner

## 2012-01-06 ENCOUNTER — Ambulatory Visit (HOSPITAL_BASED_OUTPATIENT_CLINIC_OR_DEPARTMENT_OTHER): Payer: PRIVATE HEALTH INSURANCE | Admitting: Oncology

## 2012-01-06 VITALS — BP 105/73 | HR 83 | Temp 97.4°F | Ht 66.0 in | Wt 147.9 lb

## 2012-01-06 DIAGNOSIS — C189 Malignant neoplasm of colon, unspecified: Secondary | ICD-10-CM

## 2012-01-06 DIAGNOSIS — C787 Secondary malignant neoplasm of liver and intrahepatic bile duct: Secondary | ICD-10-CM

## 2012-01-06 DIAGNOSIS — R000 Tachycardia, unspecified: Secondary | ICD-10-CM

## 2012-01-06 DIAGNOSIS — R5383 Other fatigue: Secondary | ICD-10-CM

## 2012-01-06 LAB — CBC WITH DIFFERENTIAL/PLATELET
Basophils Absolute: 0.1 10*3/uL (ref 0.0–0.1)
HCT: 33.8 % — ABNORMAL LOW (ref 34.8–46.6)
HGB: 11.6 g/dL (ref 11.6–15.9)
MONO#: 1.7 10*3/uL — ABNORMAL HIGH (ref 0.1–0.9)
NEUT#: 10.6 10*3/uL — ABNORMAL HIGH (ref 1.5–6.5)
NEUT%: 73.2 % (ref 38.4–76.8)
WBC: 14.5 10*3/uL — ABNORMAL HIGH (ref 3.9–10.3)
lymph#: 2.1 10*3/uL (ref 0.9–3.3)

## 2012-01-06 NOTE — Progress Notes (Signed)
OFFICE PROGRESS NOTE   INTERVAL HISTORY:   She resumed regorafenib after the office visit on 12/23/2011. The perineal skin breakdown has healed. She has frequent bowel movements. She has pain in the right low anterior chest/subcostal region. She takes oxycodone in frequently.  Her appetite and energy level are poor.  Objective:  Vital signs in last 24 hours:  Blood pressure 105/73, pulse 83, temperature 97.4 F (36.3 C), temperature source Oral, height 5\' 6"  (1.676 m), weight 147 lb 14.4 oz (67.087 kg).    HEENT: There is scleral icterus. No thrush or ulcers Resp: Distant breath sounds, decreased on the right greater than left. No respiratory distress. Cardio: Regular rate and rhythm GI: The liver is palpable throughout the right upper abdomen. No tenderness. Vascular: No leg edema  Skin: Hyperpigmentation at the perineum. No skin breakdown.   Portacath/PICC-without erythema  Lab Results:  Lab Results  Component Value Date   WBC 14.5* 01/06/2012   HGB 11.6 01/06/2012   HCT 33.8* 01/06/2012   MCV 88.7 01/06/2012   PLT 358 01/06/2012   ANC 10.6   Medications: I have reviewed the patient's current medications.  Assessment/Plan: 1. Metastatic colon cancer. She began salvage therapy with FOLFIRI/Avastin on 07/20/2010. A restaging CT 10/13/2010 confirmed a decrease in the right lower lobe nodule and stable to decreased size of liver metastases compared to the CT from 07/13/2010.  a. CT 01/19/2011 confirmed a decrease in the size of multiple liver metastases and a decrease in the size of the right lower lobe nodule with no evidence for new metastatic disease.  b. CT 05/04/2011 showed a stable right lower lobe nodule with no evidence of new metastatic disease. c. CT 08/09/2011 showed stable disease and no new findings.  d. CT of the chest 09/23/2011 reveals a 4.8-cm nodal mass inferior to the right atrium and left ventricle. There is also a 2.1-cm anterior right epicardial mass.  Numerous small new pulmonary nodules. e. She began regorafenib on 11/08/2011. Regorafenib was placed on hold 11/30/2011 due to skin toxicity. Regorafenib was resumed at a 40 mg daily dose on 12/23/2011. 2. History of rectal bleeding, likely related to hemorrhoids, potentially complicated by Avastin therapy. 3. History of hypertension related to Avastin. 4. Right hip/buttock pain secondary to a metastatic lesion at the right sacrum, resolved. She completed palliative radiation 07/07/2011. 5. Tachycardia, intermittent tachycardia over the past year. An extensive diagnostic evaluation for the tachycardia was unrevealing including a TSH, cortisol, and BNP. She has been evaluated by Dr. Clifton James. This included a negative CT for pulmonary embolism and a recent cardiac catheterization. The tachycardia may be related to the pericardial mass. The tachycardia has partially improved with medical therapy. She continues to be intermittently tachycardic 6. Profound malaise, likely secondary to progression of the metastatic colon cancer.  7. 2-D echocardiogram 08/17/2011 with mildly reduced systolic function with an estimated ejection fraction in the range of 45-50%. Possible moderate hypokinesis of the anterior myocardium. The right ventricular systolic pressure was increased consistent with mild pulmonary hypertension. No pericardial effusion. 8. Skin breakdown at the perineum/perianal region-likely skin toxicity related to Regorafenib. Resolved 9. Anemia-improved today  Disposition:  She appears to be tolerating the low dose regorafenib well. However her performance status continues to decline. She will complete the current cycle of regorafenib over the next one week. She is scheduled for an office visit in 2 weeks. I plan to discuss the indication for a hospice referral if her performance status does not improve over the next few  weeks.   Lucile Shutters, MD  01/06/2012  9:50 PM

## 2012-01-21 ENCOUNTER — Telehealth: Payer: Self-pay | Admitting: *Deleted

## 2012-01-21 ENCOUNTER — Ambulatory Visit: Payer: PRIVATE HEALTH INSURANCE | Admitting: Nurse Practitioner

## 2012-01-21 ENCOUNTER — Telehealth: Payer: Self-pay | Admitting: Oncology

## 2012-01-21 ENCOUNTER — Other Ambulatory Visit: Payer: Self-pay | Admitting: *Deleted

## 2012-01-21 ENCOUNTER — Other Ambulatory Visit: Payer: PRIVATE HEALTH INSURANCE

## 2012-01-21 NOTE — Telephone Encounter (Signed)
Message from pt's husband reporting she will not be able to make appt today. Requested to be rescheduled. Order sent to schedulers to move appt one week.  Left message on voicemail for pt's husband to call sooner as needed.

## 2012-01-21 NOTE — Telephone Encounter (Signed)
called pt and r/s missed appt on 02/08 to 02/14

## 2012-01-24 ENCOUNTER — Encounter (HOSPITAL_COMMUNITY): Payer: Self-pay

## 2012-01-24 ENCOUNTER — Telehealth: Payer: Self-pay | Admitting: *Deleted

## 2012-01-24 ENCOUNTER — Observation Stay (HOSPITAL_COMMUNITY)
Admission: EM | Admit: 2012-01-24 | Discharge: 2012-02-11 | Disposition: E | Payer: PRIVATE HEALTH INSURANCE | Attending: Internal Medicine | Admitting: Internal Medicine

## 2012-01-24 DIAGNOSIS — C189 Malignant neoplasm of colon, unspecified: Principal | ICD-10-CM | POA: Insufficient documentation

## 2012-01-24 DIAGNOSIS — R4182 Altered mental status, unspecified: Secondary | ICD-10-CM | POA: Insufficient documentation

## 2012-01-24 DIAGNOSIS — R000 Tachycardia, unspecified: Secondary | ICD-10-CM | POA: Insufficient documentation

## 2012-01-24 DIAGNOSIS — C787 Secondary malignant neoplasm of liver and intrahepatic bile duct: Secondary | ICD-10-CM | POA: Insufficient documentation

## 2012-01-24 DIAGNOSIS — I429 Cardiomyopathy, unspecified: Secondary | ICD-10-CM | POA: Diagnosis present

## 2012-01-24 DIAGNOSIS — Z66 Do not resuscitate: Secondary | ICD-10-CM | POA: Insufficient documentation

## 2012-01-24 DIAGNOSIS — C799 Secondary malignant neoplasm of unspecified site: Secondary | ICD-10-CM

## 2012-01-24 DIAGNOSIS — R001 Bradycardia, unspecified: Secondary | ICD-10-CM

## 2012-01-24 DIAGNOSIS — I959 Hypotension, unspecified: Secondary | ICD-10-CM | POA: Insufficient documentation

## 2012-01-24 LAB — GLUCOSE, CAPILLARY: Glucose-Capillary: 95 mg/dL (ref 70–99)

## 2012-01-24 MED ORDER — ONDANSETRON HCL 4 MG/2ML IJ SOLN: 4.0000 mg | Freq: Four times a day (QID) | INTRAMUSCULAR | Status: DC | PRN

## 2012-01-24 MED ORDER — LORAZEPAM 2 MG/ML IJ SOLN
INTRAMUSCULAR | Status: AC
  Administered 2012-01-24: 2 mg via INTRAVENOUS
  Filled 2012-01-24: qty 1

## 2012-01-24 MED ORDER — FENTANYL CITRATE 0.05 MG/ML IJ SOLN
INTRAMUSCULAR | Status: AC
  Administered 2012-01-24: 100 ug
  Filled 2012-01-24: qty 2

## 2012-01-24 MED ORDER — HYDROMORPHONE HCL PF 1 MG/ML IJ SOLN
2.0000 mg | INTRAMUSCULAR | Status: DC | PRN
  Filled 2012-01-24: qty 2

## 2012-01-24 MED ORDER — LORAZEPAM 2 MG/ML IJ SOLN
1.0000 mg | INTRAMUSCULAR | Status: DC | PRN
  Administered 2012-01-24: 2 mg via INTRAVENOUS
  Administered 2012-01-25: 1 mg via INTRAVENOUS
  Filled 2012-01-24 (×2): qty 1

## 2012-01-24 MED ORDER — ONDANSETRON HCL 4 MG/2ML IJ SOLN: 4.0000 mg | Freq: Three times a day (TID) | INTRAMUSCULAR | Status: DC | PRN

## 2012-01-24 MED ORDER — HYDROMORPHONE HCL PF 1 MG/ML IJ SOLN: 1.0000 mg | INTRAMUSCULAR | Status: DC | PRN

## 2012-01-24 MED ORDER — DEXTROSE 50 % IV SOLN
INTRAVENOUS | Status: AC
  Filled 2012-01-24: qty 50

## 2012-01-24 MED ORDER — ONDANSETRON HCL 4 MG PO TABS: 4.0000 mg | ORAL_TABLET | Freq: Four times a day (QID) | ORAL | Status: DC | PRN

## 2012-01-24 MED ORDER — SODIUM CHLORIDE 0.9 % IV SOLN: INTRAVENOUS | Status: DC

## 2012-01-24 NOTE — ED Notes (Signed)
Family at bedside, pt is resting comfortably. Comfort measure to family.

## 2012-01-24 NOTE — ED Notes (Signed)
ZOX:WRUE<AV> Expected date:Feb 15, 2012<BR> Expected time: 7:38 PM<BR> Means of arrival:Ambulance<BR> Comments:<BR> EMS 30 GC - cancer pt/hypoglycemia

## 2012-01-24 NOTE — ED Notes (Signed)
CBG 95 

## 2012-01-24 NOTE — Telephone Encounter (Addendum)
Dr. Truett Perna called husband and discussed current status and prognosis. Both agree that Hospice care is best for her at this time. May require admit to [facility] soon. Husband agrees that Do Not Resuscitate status is appropriate at this time. Called Hospice intake and requested referral with nurse visit tonight if possible. They will be able to see her tonight. Referral form faxed. Husband notified.

## 2012-01-24 NOTE — ED Notes (Signed)
Family given drinks and offered food. Pt resting.

## 2012-01-24 NOTE — ED Notes (Signed)
Pastor at bedside with family.

## 2012-01-24 NOTE — ED Provider Notes (Signed)
History    Patient was brought in by EMS in extremis. Patient has widely metastatic cancer. Liver, lung, sacral, mets. She was hypotensive pre-hospital with a heart rate in the 40s. On arrival patient was being externally paced. Oxygen saturations were in the 80s and blood pressure was 60/30. Multiple family members at bedside. Care continued on arrival until confirmed with them that they would not want continued care. Pt apparently has extremely poor prognosis. Husband and other family members state that patient's wishes and their wishes are to make patient as comfortable as he can. They understand that without continuing care that there is a good chance that she may die.  CSN: [id number]  Arrival date & time 02/07/2012  1948   First MD Initiated Contact with Patient 01/24/2012 2005      Chief Complaint  Patient presents with  . Colon Cancer  . Hypoglycemia  . Hypotension    (Consider location/radiation/quality/duration/timing/severity/associated sxs/prior treatment) HPI  Past Medical History  Diagnosis Date  . Colon cancer     colon ca dx 2/08    Past Surgical History  Procedure Date  . Cholecystectomy 1975  . Tubal ligation 1975  . Colectomy 2008  . Ovary surgery 2008  . Liver biopsy     Family History  Problem Relation Age of Onset  . Colon cancer Brother   . Diabetes Mother   . Hypertension Father   . Heart attack Brother     Age 73, died MI    History  Substance Use Topics  . Smoking status: Never Smoker   . Smokeless tobacco: Not on file  . Alcohol Use: No    OB History    Grav Para Term Preterm Abortions TAB SAB Ect Mult Living                  Review of Systems   Review of symptoms negative unless otherwise noted in HPI.   Allergies  Review of patient's allergies indicates no known allergies.  Home Medications   Current Outpatient Rx  Name Route Sig Dispense Refill  . MAGIC MOUTHWASH Oral Take 5 mLs by mouth 4 (four) times daily as needed.  Swish and spit 600 mL 0  . LACTULOSE 10 GM/15ML PO SOLN Oral Take 30 mLs (20 g total) by mouth daily. 960 mL 3  . METOPROLOL TARTRATE 50 MG PO TABS  Take one and one half tablets by mouth twice daily 90 tablet 6  . NON FORMULARY  IRON 65 mg  1 tab three times a day    . OVER THE COUNTER MEDICATION  Stool softner 2 tabs daily     . OXYCODONE HCL 5 MG PO CAPS Oral Take 1 capsule (5 mg total) by mouth every 4 (four) hours as needed for pain. 100 capsule 0    Handwritten by Lonna Cobb, NP 12/06/11  . REGORAFENIB 40 MG PO TABS Oral Take 1 tablet (40 mg total) by mouth daily with breakfast. Take with low fat meal. Caution: Chemotherapy. 3 weeks on and 1 week off 28 tablet 0    There were no vitals taken for this visit.  Physical Exam  Nursing note and vitals reviewed. Constitutional: She appears distressed.  HENT:  Head: Normocephalic and atraumatic.  Eyes: Conjunctivae are normal. Pupils are equal, round, and reactive to light. Right eye exhibits no discharge. Left eye exhibits no discharge.       Marked scleral icterus  Neck: Neck supple.  Cardiovascular: Normal rate, regular rhythm and  normal heart sounds.  Exam reveals no gallop and no friction rub.   No murmur heard.      Bradycardic. No palpable radial pulses. Faint carotid pulses. Extremities cool to touch.  Pulmonary/Chest: Effort normal and breath sounds normal. No respiratory distress.  Abdominal: Soft. She exhibits no distension. There is no tenderness.  Musculoskeletal: She exhibits no edema and no tenderness.  Neurological: She exhibits normal muscle tone.       Appears to be moving all extremities equally.  Skin: Skin is warm and dry.  Psychiatric:       Agitated and moaning.    ED Course  Procedures (including critical care time)  CRITICAL CARE Performed by: Raeford Razor   Total critical care time: 35 minutes   Critical care time was exclusive of separately billable procedures and treating other  patients.  Critical care was necessary to treat or prevent imminent or life-threatening deterioration.  Critical care was time spent personally by me on the following activities: development of treatment plan with patient and/or surrogate as well as nursing, discussions with consultants, evaluation of patient's response to treatment, examination of patient, obtaining history from patient or surrogate, ordering and performing treatments and interventions, ordering and review of laboratory studies, ordering and review of radiographic studies, pulse oximetry and re-evaluation of patient's condition.    Labs Reviewed  GLUCOSE, CAPILLARY   No results found.   1. Hypotension   2. Bradycardia   3. Altered mental status   4. Metastatic cancer       MDM  66 year old female with AMS, bradycardia and hypotension. Patient has widely metastatic cancer and extremely poor prognosis. In extremis. Discussions had with family members would like to focus on comfort care and would not want to pursue further medical care at this point which I think is reasonable.        Raeford Razor, MD 02/04/2012 2128

## 2012-01-24 NOTE — ED Notes (Signed)
Family at bedside. 

## 2012-01-24 NOTE — ED Notes (Signed)
Pt was going to start receiving hospice care tonight, she became lethargic and unresponsive so EMS was called, her blood sugar was 37 per ems and her heart rate was in the 40's, Dr Juleen China and family at bedside

## 2012-01-24 NOTE — Telephone Encounter (Signed)
Husband called to report that patient is taking no po's--won't take food, liquid or meds now. Will not open mouth or suck straw. Makes moaning noises, but can't be understood. Moves her feet and legs, but can't move herself in the bed. Incontinent of bowel and bladder. Still very jaundiced. Does not appear to be very uncomfortable. Wanted MD aware.

## 2012-01-24 NOTE — Progress Notes (Signed)
Spiritual Care Note: Pt is dying of complications of cancer and gathered family is aware of this fact. Pt is on comfort care and family is in agreement that this is all they wish for the pt. Chaplain provided spiritual counsel and grief support. Prayers raised for a smooth and peaceful death transition. Time spent with pt husband dealing with grief issues brought out pt long standing wishes not to have her death experience prolonged by actions that will not lead to a productive life.  Family Renato Gails is in transit.  Benjie Karvonen. Autymn Omlor, DMin Chaplain

## 2012-01-24 NOTE — ED Notes (Signed)
Per EMs pt. Was from home , lethargic, hypoglycemic, hypotensive and with  HR at 40's. Pt. Has been a hospice with cancer. Pt.'s family at bedside.

## 2012-01-24 NOTE — H&P (Signed)
PCP:   Barkley Bruns, MD, MD   Chief Complaint:  Unresponsive and agitated  HPI: 66 year old female who's been fighting colon cancer for several years now who comes in after 24 hours of not being able to speak in rapid deterioration in her mental status. Her husband and 2 children are here to discuss comfort care measures only at this point. When she arrived in the emergency department her systolics were in the 60s. They do not wish to have any aggressive treatment including no antibiotics and no IV fluids. They only wished to have comfort measures only. This is based on her wishes that she has expressed to them recently. They do not wish either to have vital signs checked.  Review of Systems:  Otherwise noncontributory  Past Medical History: Past Medical History  Diagnosis Date  . Colon cancer     colon ca dx 2/08   Past Surgical History  Procedure Date  . Cholecystectomy 1975  . Tubal ligation 1975  . Colectomy 2008  . Ovary surgery 2008  . Liver biopsy     Medications: Prior to Admission medications   Medication Sig Start Date End Date Taking? Authorizing Provider  Alum & Mag Hydroxide-Simeth (MAGIC MOUTHWASH) SOLN Take 5 mLs by mouth 4 (four) times daily as needed. Swish and spit 11/19/11   Lucile Shutters, MD  lactulose (CHRONULAC) 10 GM/15ML solution Take 30 mLs (20 g total) by mouth daily. 10/22/11 12/15/11  Lucile Shutters, MD  metoprolol (LOPRESSOR) 50 MG tablet Take one and one half tablets by mouth twice daily 10/18/11   Verne Carrow, MD  NON FORMULARY IRON 65 mg  1 tab three times a day    Historical Provider, MD  OVER THE COUNTER MEDICATION Stool softner 2 tabs daily     Historical Provider, MD  oxycodone (OXY-IR) 5 MG capsule Take 1 capsule (5 mg total) by mouth every 4 (four) hours as needed for pain. 12/06/11   Lonna Cobb, NP  regorafenib (STIVARGA) 40 MG tablet Take 1 tablet (40 mg total) by mouth daily with breakfast. Take with low fat  meal. Caution: Chemotherapy. 3 weeks on and 1 week off 12/29/11   Lucile Shutters, MD    Allergies:  No Known Allergies  Social History:  reports that she has never smoked. She does not have any smokeless tobacco history on file. She reports that she does not drink alcohol. Her drug history not on file.  Family History: Family History  Problem Relation Age of Onset  . Colon cancer Brother   . Diabetes Mother   . Hypertension Father   . Heart attack Brother     Age 54, died MI    Physical Exam: Filed Vitals:   01/30/2012 2005 01/23/2012 2135  BP: 64/40 52/27  Pulse: 43 45  Resp: 17    BP 52/27  Pulse 45  Resp 17 General appearance: no distress Lungs: wheezes bibasilar Heart: regular rate and rhythm, S1, S2 normal, no murmur, click, rub or gallop Extremities: extremities normal, atraumatic, no cyanosis or edema Skin: Skin color, texture, turgor normal. No rashes or lesions Neurologic: Mental status: obtunded    Labs on Admission:   Radiological Exams on Admission: No results found.  Assessment/Plan Present on Admission:  66 year old female with metastatic colon cancer is being admitted for comfort care   she appears comfortable right now she's received Dilaudid in the emergency department. Provide her with Zofran, Ativan, and Dilaudid as needed. Patient is DO NOT RESUSCITATE. Family  does not wish any IV fluids no antibiotics no IV sticks and no vital signs taken.    DAVID,RACHAL A [phone] 01/19/2012, 9:44 PM

## 2012-01-25 DIAGNOSIS — R4182 Altered mental status, unspecified: Secondary | ICD-10-CM | POA: Diagnosis present

## 2012-01-25 DIAGNOSIS — I959 Hypotension, unspecified: Secondary | ICD-10-CM | POA: Diagnosis present

## 2012-01-25 MED ORDER — ATROPINE SULFATE 1 % OP SOLN: 2.0000 [drp] | Freq: Four times a day (QID) | OPHTHALMIC | Status: DC

## 2012-01-25 MED ORDER — HYDROMORPHONE HCL PF 1 MG/ML IJ SOLN
2.0000 mg | INTRAMUSCULAR | Status: DC | PRN
  Administered 2012-01-25 (×2): 2 mg via INTRAVENOUS
  Administered 2012-01-25: 1 mg via INTRAVENOUS
  Filled 2012-01-25: qty 2
  Filled 2012-01-25: qty 1
  Filled 2012-01-25: qty 2

## 2012-01-25 MED ORDER — MORPHINE SULFATE (CONCENTRATE) 20 MG/ML PO SOLN
10.0000 mg | ORAL | Status: DC | PRN
  Administered 2012-01-25: 10 mg via ORAL
  Filled 2012-01-25: qty 1

## 2012-01-27 ENCOUNTER — Other Ambulatory Visit: Payer: PRIVATE HEALTH INSURANCE | Admitting: Lab

## 2012-01-27 ENCOUNTER — Ambulatory Visit: Payer: PRIVATE HEALTH INSURANCE | Admitting: Oncology

## 2012-01-27 ENCOUNTER — Ambulatory Visit: Payer: PRIVATE HEALTH INSURANCE | Admitting: Nurse Practitioner

## 2012-02-11 NOTE — Significant Event (Signed)
Agree with Noreene Larsson Wine's note.  Allayne Butcher [facility]  02/02/2012  12:02 PM

## 2012-02-11 NOTE — Progress Notes (Signed)
UR complete 

## 2012-02-11 NOTE — Discharge Summary (Signed)
Expiration Note  Sylvia Walsh  MR#: [id number]  DOB:07/20/1946  Date of Admission: 2012/02/07 Date of Death: 02/08/12  Attending Physician:Lavita Pontius A  Patient's PCP: Barkley Bruns, MD, MD  Consults:[place]  Cause of Death: Malignant neoplasm of colon, unspecified site  History: 67 year old female who's been fighting colon cancer for several years now who comes in after 24 hours of not being able to speak in rapid deterioration in her mental status. Her husband and 2 children are here to discuss comfort care measures only at this point. When she arrived in the emergency department her systolics were in the 60s. They do not wish to have any aggressive treatment including no antibiotics and no IV fluids. They only wished to have comfort measures only. This is based on her wishes that she has expressed to them recently. They do not wish either to have vital signs checked.  Secondary Diagnoses Present on Admission:  .Tachycardia .Cardiomyopathy secondary .Malignant neoplasm of colon, unspecified site .Secondary malignant neoplasm of liver .Hypotension .Altered mental state   Hospital Course: Patient admitted last night by Dr. Onalee Hua, as mentioned above she's been battling colon cancer for several years. In the low to the emergency department patient had a systolic blood pressure in the 60s. She does have also mental status changes. Family at bedside at the time of admission. They stayed with her until she passed away. She passed away at 1149. I got notified by Blanchie Serve RN about the death. I did not see the patient.  Time of death 02/08/2012 at 11:49 AM  Signed: Clydia Llano A 02-08-2012, 12:08 PM

## 2012-02-11 NOTE — Significant Event (Signed)
Patient passed away @ 1149.  Family @ bedside.  Will notfiy [place] [facility].  @ RNs may pronounce order given by Dr. Arthor Captain.

## 2012-02-11 NOTE — Progress Notes (Signed)
IP PROGRESS NOTE  Subjective:   She is well known to me with a history of advanced metastatic colon cancer. She was brought to the emergency room by her family on January 30, 2012 with decreased responsiveness. Her husband reports she has been confused and less responsive for the past several days.  Objective: Vital signs in last 24 hours: Blood pressure 52/27, pulse 45, temperature 93.7 F (34.3 C), resp. rate 8, SpO2 0.00%.  Intake/Output from previous day:    Physical Exam:  Unresponsive HEENT: There is scleral icterus Lungs: There is bilateral air movement with rhonchi at the left upper anterior chest, irregular respirations Cardiac: Bradycardic Abdomen: The liver is enlarged Extremities: No edema   Portacath/PICC-without erythema  Medications: I have reviewed the patient's current medications.  Assessment/Plan:  She has advanced metastatic colon cancer. The colon cancer has progressed despite multiple systemic chemotherapy regimens. She was most recently treated with regorafenib.  She is now unresponsive and appears to be declining rapidly. I discussed the situation with her husband and son. They understand her lifespan will likely be limited to hours or a few days. The plan is to continue comfort care measures.  I appreciate the care from the hospitalist service.   LOS: 1 day   Sylvia Walsh Sylvia Walsh  02/09/2012, 7:56 AM

## 2012-02-11 DEATH — deceased

## 2012-12-11 IMAGING — CT CT ANGIO CHEST
2 of 6 series · 19 of 36 positions shown · IV contrast (Omnipaque 300)
Comparison: Chest CT dated 08/09/2011

CLINICAL DATA: Tachycardia.  Elevated D-dimer.

CT ANGIOGRAPHY CHEST WITH CONTRAST
TECHNIQUE: Multidetector CT imaging of the chest was performed
using the standard protocol during bolus administration of
intravenous contrast.  Multiplanar CT image reconstructions
including MIPs were obtained to evaluate the vascular anatomy.
Contrast: 80mL OMNIPAQUE IOHEXOL 300 MG/ML IV SOLN

[Series 6: thins (id) / (id) · axial · 0.61mm/px · z∈[-228,-12]mm · 18 of 240 slices shown]
[im 12/240  lung]
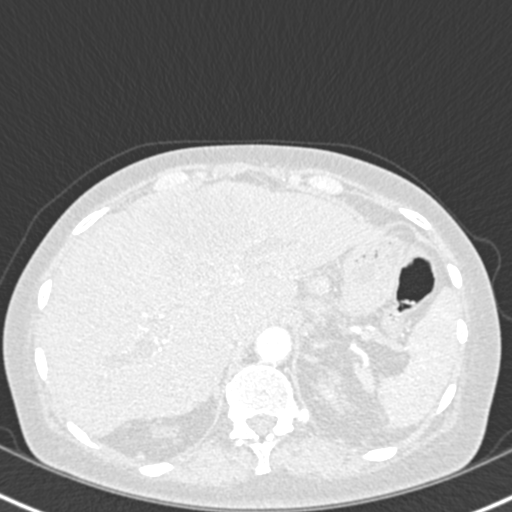
[im 24/240  mediastinal]
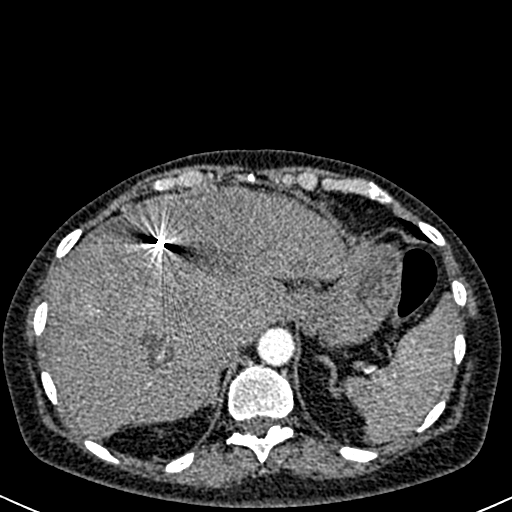
[im 36/240  lung]
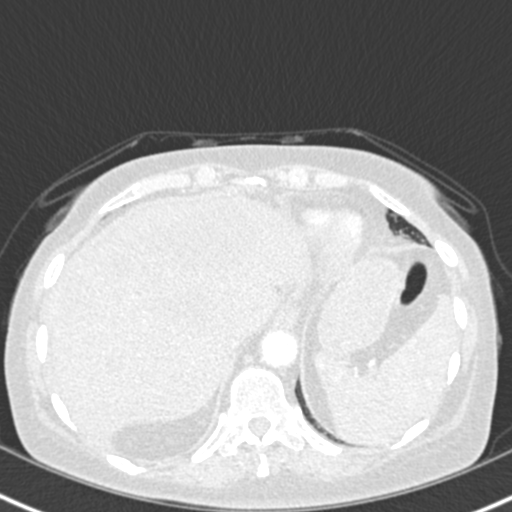
[im 48/240  mediastinal]
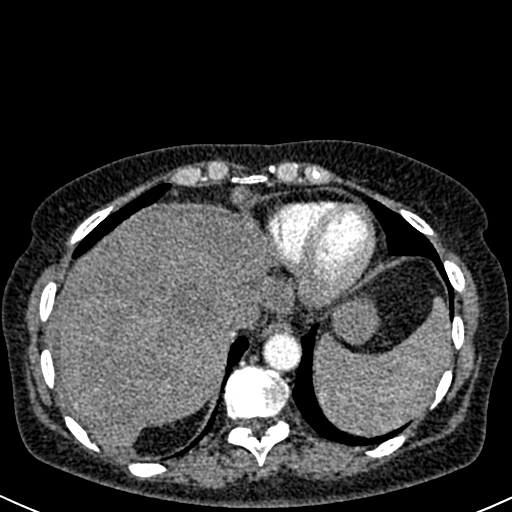
[im 60/240  lung]
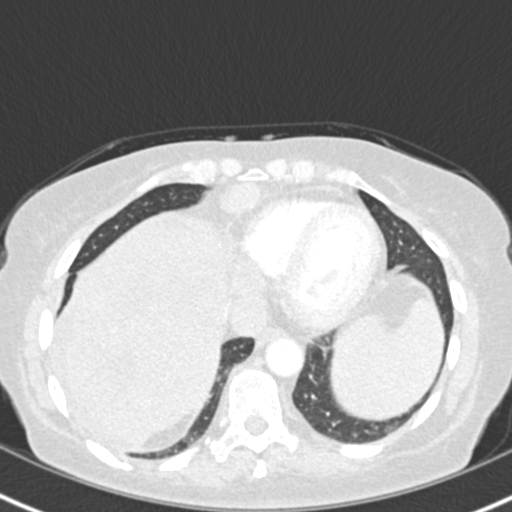
[im 72/240  mediastinal]
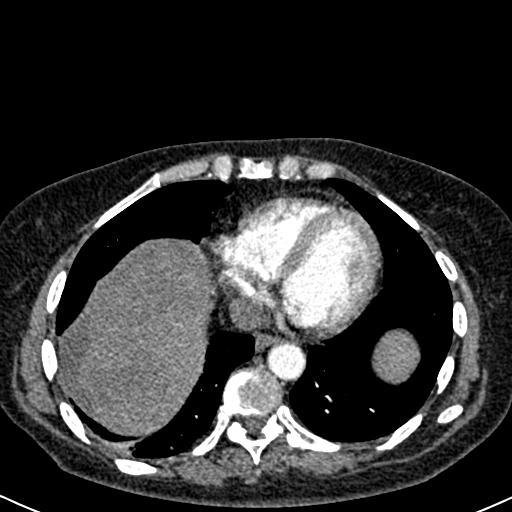
[im 84/240  lung]
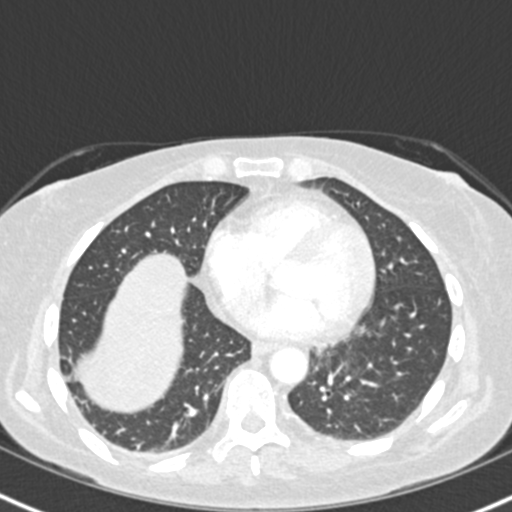
[im 96/240  mediastinal]
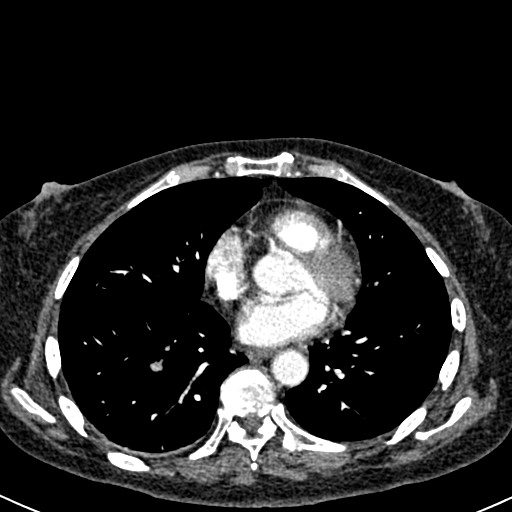
[im 108/240  lung]
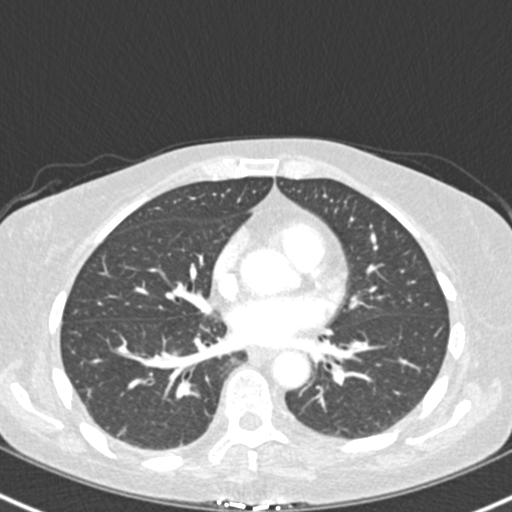
[im 132/240  mediastinal]
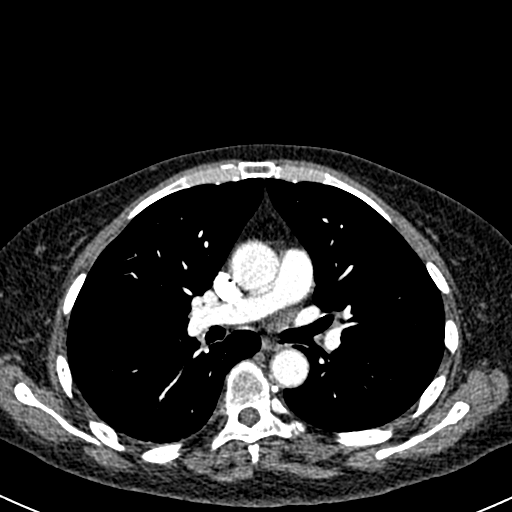
[im 144/240  lung]
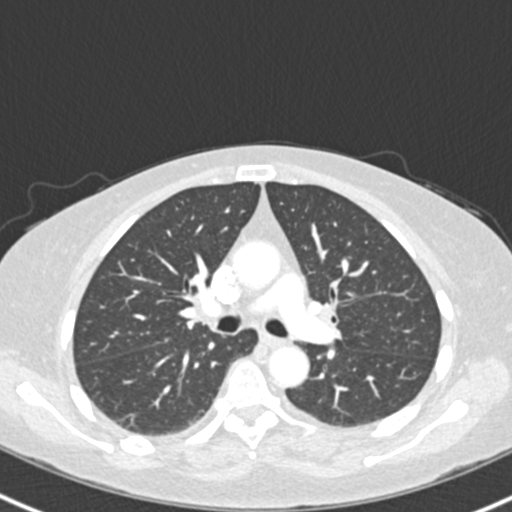
[im 156/240  mediastinal]
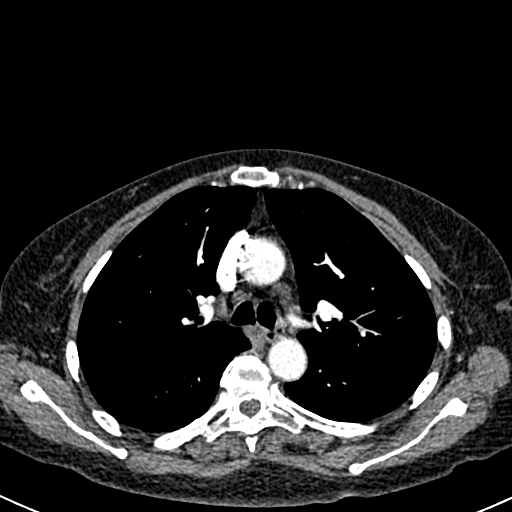
[im 168/240  lung]
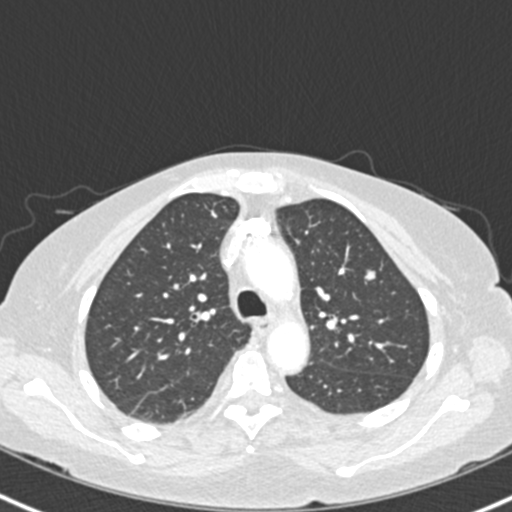
[im 180/240  mediastinal]
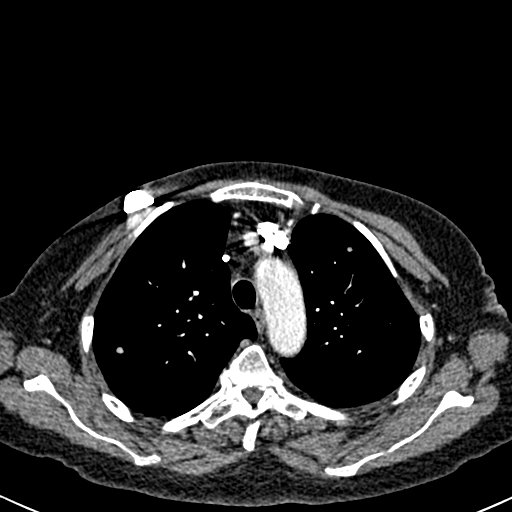
[im 192/240  lung]
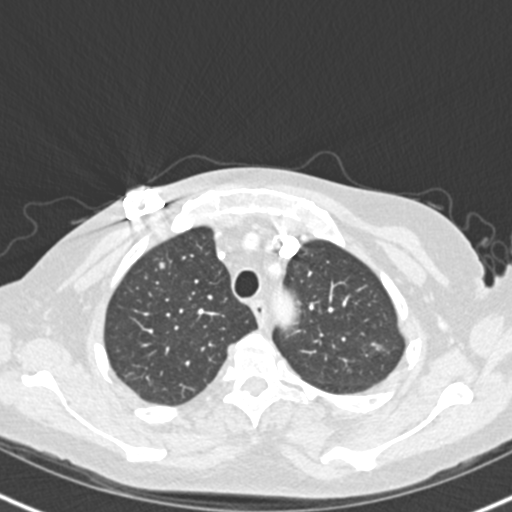
[im 204/240  mediastinal]
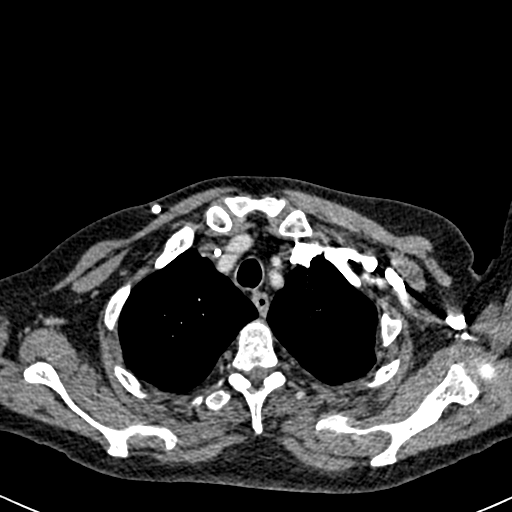
[im 216/240  lung]
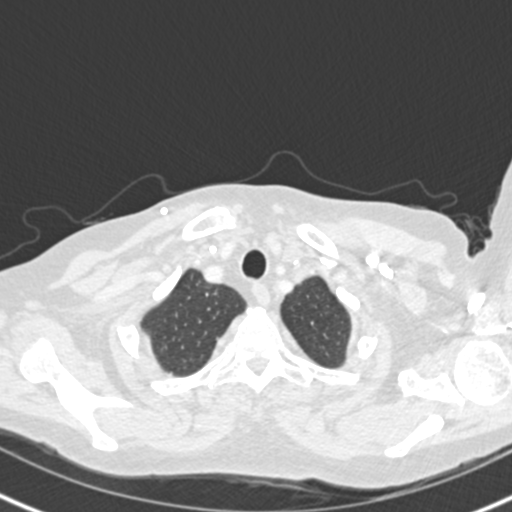
[im 228/240  mediastinal]
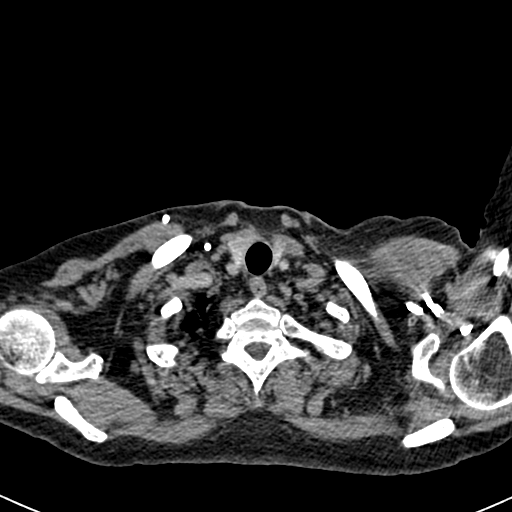

[Series 602: cor mpr · coronal · 0.61mm/px · 1 of 96 slices shown]
[im 48/96  mediastinal]
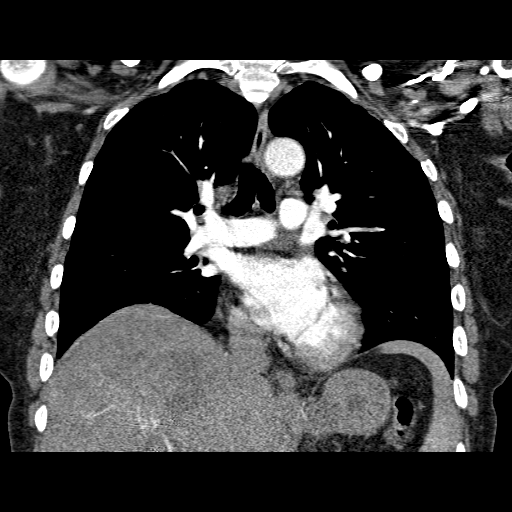

[19 of 36 positions shown; findings below may reference images not displayed]

FINDINGS: There are no pulmonary emboli.  However, the patient has
developed significant progression of metastatic disease in the
chest with numerous new bilateral pulmonary nodules.  There is a
4.8 x 4.7 x 2.1 cm nodal mass lying inferior to the right atrium
and left ventricle.  There is also a 0.2 x 1.8 x 2.1 cm anterior
right epicardial mass.

The mass in the right lower lobe has increased to 2.7 x 1.7 cm.
There is a new 7.4 mm nodule in the right lower lobe on image
number 41 of series 7.  There is an 8 mm nodule in the left upper
lobe on image number 25.  There are numerous smaller new pulmonary
nodules bilaterally.

Heart size is normal.

Again noted is extensive metastatic disease in the liver.  No
infiltrates or effusions.  No significant hilar or mediastinal
adenopathy.  No focal bone lesions.

Review of the MIP images confirms the above findings.
IMPRESSION: 1.  No acute pulmonary emboli.
2.  Marked progression of metastatic disease in the chest.

## 2015-12-02 ENCOUNTER — Encounter: Payer: Self-pay | Admitting: *Deleted

## 2015-12-02 NOTE — Progress Notes (Signed)
12/02/15 at 3:16pm - The research nurse received a query from the [employer] clinical trial study group on this pt that was issued 11/24/15.  This pt was enrolled in the study in 2008.  The study wanted the research nurse to enter a measurement for the "right ovary" on the baseline tumor evaluation form.  This field was missing on the pt's submitted forms.  The research nurse and the research manager reviewed the pt's chart and was unable to locate a measurement at baseline for the "right ovary".  Kenton Kingfisher, Geophysicist/field seismologist, contacted Dr. Leonia Reeves, radiologist, about this measurement.  Dr. Leonia Reeves pulled up the pt's 12/29/2006 CT abdomen/pelvis and stated that the pt's right ovary measured 3.2 cm x 1.5 cm at baseline.  The research nurse will enter this measurement for query resolution.  The research nurse will have Dr. Benay Spice, the pt's investigator, review this data before submitted this query.   Brion Aliment RN, BSN, CCRP Clinical Research Nurse 12/02/2015 3:25 PM   12/03/15 at 11:45am- The research nurse and Dr. Benay Spice discussed this pt's RECIST table.  He agreed that the pt's right ovary measurement should be added to the baseline form for query resolution.  Dr. Benay Spice appreciated Dr. Alvia Grove help on providing the right ovary measurement. He said that the study needs to advise our site whether we should remove the target lesion- left ovarian mass since it was later biopsied and found to be benign.  He said that it is very likely that the "right ovary" was also benign.  Dr. Benay Spice said that we should let [facility] advise our site on whether to remove these ovarian lesions from the RECIST.  The research nurse completed the query and then asked the study to advise our site on whether to remove these target lesions from the RECIST table in retrospect.  Will await guidance from the study base, [employer]. Brion Aliment RN, BSN Clinical Research Nurse 12/03/2015 11:54 AM
